# Patient Record
Sex: Female | Born: 1975
Health system: Southern US, Community
[De-identification: ages and names within clinical notes are randomized; demographics above are authoritative.]

## PROBLEM LIST (undated history)

## (undated) DIAGNOSIS — J45909 Unspecified asthma, uncomplicated: Secondary | ICD-10-CM

## (undated) DIAGNOSIS — C4491 Basal cell carcinoma of skin, unspecified: Secondary | ICD-10-CM

## (undated) DIAGNOSIS — D239 Other benign neoplasm of skin, unspecified: Secondary | ICD-10-CM

## (undated) DIAGNOSIS — K219 Gastro-esophageal reflux disease without esophagitis: Secondary | ICD-10-CM

## (undated) HISTORY — DX: Unspecified asthma, uncomplicated: J45.909

## (undated) HISTORY — DX: Gastro-esophageal reflux disease without esophagitis: K21.9

## (undated) HISTORY — DX: Other benign neoplasm of skin, unspecified: D23.9

## (undated) HISTORY — DX: Basal cell carcinoma of skin, unspecified: C44.91

---

## 2013-12-01 ENCOUNTER — Encounter: Payer: Self-pay | Admitting: Internal Medicine

## 2014-01-21 ENCOUNTER — Ambulatory Visit: Payer: Self-pay | Admitting: Family Medicine

## 2014-02-03 ENCOUNTER — Ambulatory Visit (INDEPENDENT_AMBULATORY_CARE_PROVIDER_SITE_OTHER): Payer: 59 | Admitting: Family Medicine

## 2014-02-03 ENCOUNTER — Encounter: Payer: Self-pay | Admitting: Family Medicine

## 2014-02-03 VITALS — BP 108/68 | HR 54 | Temp 97.8°F | Ht 63.25 in | Wt 119.0 lb

## 2014-02-03 DIAGNOSIS — K219 Gastro-esophageal reflux disease without esophagitis: Secondary | ICD-10-CM

## 2014-02-03 DIAGNOSIS — Z7689 Persons encountering health services in other specified circumstances: Secondary | ICD-10-CM

## 2014-02-03 DIAGNOSIS — J4520 Mild intermittent asthma, uncomplicated: Secondary | ICD-10-CM

## 2014-02-03 DIAGNOSIS — J45909 Unspecified asthma, uncomplicated: Secondary | ICD-10-CM

## 2014-02-03 MED ORDER — ALBUTEROL SULFATE HFA 108 (90 BASE) MCG/ACT IN AERS: 2.0000 | INHALATION_SPRAY | RESPIRATORY_TRACT | Status: DC | PRN

## 2014-02-03 MED ORDER — ESOMEPRAZOLE MAGNESIUM 20 MG PO CPDR
20.0000 mg | DELAYED_RELEASE_CAPSULE | Freq: Every day | ORAL | Status: DC
Start: 2014-02-03 — End: 2017-10-28

## 2014-02-03 NOTE — Patient Instructions (Addendum)
-  wear good sunscreen  -We have ordered labs or studies at this visit. It can take up to 1-2 weeks for results and processing. We will contact you with instructions IF your results are abnormal. Normal results will be released to your [place]. If you have not heard from [place] or can not find your results in [facility] in 2 weeks please contact our office.  -PLEASE SIGN UP FOR MYCHART TODAY   We recommend the following healthy lifestyle measures: - eat a healthy diet consisting of lots of vegetables, fruits, beans, nuts, seeds, healthy meats such as white chicken and fish and whole grains.  - avoid fried foods, fast food, processed foods, sodas, red meet and other fattening foods.  - get a least 150 minutes of aerobic exercise per week.   Follow up in: 1 year or as needed

## 2014-02-03 NOTE — Progress Notes (Signed)
Pre visit review using our clinic review tool, if applicable. No additional management support is needed unless otherwise documented below in the visit note. 

## 2014-02-03 NOTE — Progress Notes (Signed)
No chief complaint on file.   HPI:  Adrena Nakamura is here to establish care. Recently moved here from [place]. Last PCP and physical: Marylynn Pearson - gyn, has IUD, gets pap yearly  Has the following chronic problems and concerns today:  There are no active problems to display for this patient.  GERD: -for about 5 years since pregnancy -on nexium 22.3 mg -heartburn dailly more at night when she lies down if does not take this -no symptoms if takes, asthma symptoms, wheezing, dysphagia, nausea, weight loss, chronic sinus issues  Asthma, Mild Intermittent: -never had PFTs -gets cough with URIs and responds well to  qvar and albuterol  -denies: wheezing, SOB, DOE  ROS negative for unless reported above: fevers, unintentional weight loss, hearing or vision loss, chest pain, palpitations, struggling to breath, hemoptysis, melena, hematochezia, hematuria, falls, loc, si, thoughts of self harm  Past Medical History  Diagnosis Date  . Asthma   . GERD (gastroesophageal reflux disease)   . Dysplastic nevi   . BCC (basal cell carcinoma of skin)     Family History  Problem Relation Age of Onset  . Cancer Mother     lymphoma  . Cancer Maternal Grandmother     ? leukemia, uterine cancer  . COPD Paternal Grandmother     History   Social History  . Marital Status: Married    Spouse Name: N/A    Number of Children: N/A  . Years of Education: N/A   Social History Main Topics  . Smoking status: Never Smoker   . Smokeless tobacco: None  . Alcohol Use: No     Comment: very rare  . Drug Use: Yes  . Sexual Activity: None   Other Topics Concern  . 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 [employer] Situation: lives with husband Denyse Amass - children 5 and 28 yo      Spiritual Beliefs: Christian      Lifestyle:  Regular CV exercise; diet is healthy             Current outpatient prescriptions:esomeprazole  (NEXIUM) 20 MG capsule, Take 1 capsule (20 mg total) by mouth daily at 12 noon., Disp: 90 capsule, Rfl: 3;  albuterol (PROVENTIL HFA;VENTOLIN HFA) 108 (90 BASE) MCG/ACT inhaler, Inhale 2 puffs into the lungs every 4 (four) hours as needed for wheezing or shortness of breath., Disp: 1 Inhaler, Rfl: 2;  beclomethasone (QVAR) 40 MCG/ACT inhaler, Inhale into the lungs 2 (two) times daily., Disp: , Rfl:   EXAM:  Filed Vitals:   02/03/14 1435  BP: 108/68  Pulse: 54  Temp: 97.8 F (36.6 C)    Body mass index is 20.9 kg/(m^2).  GENERAL: vitals reviewed and listed above, alert, oriented, appears well hydrated and in no acute distress  HEENT: atraumatic, conjunttiva clear, no obvious abnormalities on inspection of external nose and ears  NECK: no obvious masses on inspection  LUNGS: clear to auscultation bilaterally, no wheezes, rales or rhonchi, good air movement  CV: HRRR, no peripheral edema  MS: moves all extremities without noticeable abnormality  PSYCH: pleasant and cooperative, no obvious depression or anxiety  ASSESSMENT AND PLAN:  Discussed the following assessment and plan:  Encounter to establish care  Gastroesophageal reflux disease without esophagitis - Plan: esomeprazole (NEXIUM) 20 MG capsule  Mild intermittent asthma, uncomplicated - Plan: albuterol (PROVENTIL HFA;VENTOLIN HFA) 108 (90 BASE) MCG/ACT inhaler  -We reviewed  the PMH, PSH, FH, SH, Meds and Allergies. -We provided refills for any medications we will prescribe as needed. -We addressed current concerns per orders and patient instructions. -We have asked for records for pertinent exams, studies, vaccines and notes from previous providers. -We have advised patient to follow up per instructions below   -Patient advised to return or notify a doctor immediately if symptoms worsen or persist or new concerns arise.  Patient Instructions  -wear good sunscreen  -We have ordered labs or studies at this visit. It  can take up to 1-2 weeks for results and processing. We will contact you with instructions IF your results are abnormal. Normal results will be released to your [facility]. If you have not heard from [place] or can not find your results in [facility] in 2 weeks please contact our office.  -PLEASE SIGN UP FOR MYCHART TODAY   We recommend the following healthy lifestyle measures: - eat a healthy diet consisting of lots of vegetables, fruits, beans, nuts, seeds, healthy meats such as white chicken and fish and whole grains.  - avoid fried foods, fast food, processed foods, sodas, red meet and other fattening foods.  - get a least 150 minutes of aerobic exercise per week.   Follow up in: 1 year or as needed      Cashay Manganelli, Jarrett Soho R.

## 2014-02-08 ENCOUNTER — Ambulatory Visit: Payer: Self-pay | Admitting: Internal Medicine

## 2015-03-31 ENCOUNTER — Encounter: Payer: Self-pay | Admitting: Family Medicine

## 2015-03-31 ENCOUNTER — Ambulatory Visit (INDEPENDENT_AMBULATORY_CARE_PROVIDER_SITE_OTHER)
Admission: RE | Admit: 2015-03-31 | Discharge: 2015-03-31 | Disposition: A | Discharge status: Home / Self Care | Payer: 59 | Source: Ambulatory Visit | Attending: Family Medicine | Admitting: Family Medicine

## 2015-03-31 ENCOUNTER — Ambulatory Visit (INDEPENDENT_AMBULATORY_CARE_PROVIDER_SITE_OTHER): Payer: 59 | Admitting: Family Medicine

## 2015-03-31 VITALS — BP 108/70 | HR 84 | Temp 98.3°F | Wt 117.0 lb

## 2015-03-31 DIAGNOSIS — J4520 Mild intermittent asthma, uncomplicated: Secondary | ICD-10-CM

## 2015-03-31 DIAGNOSIS — J069 Acute upper respiratory infection, unspecified: Secondary | ICD-10-CM | POA: Diagnosis not present

## 2015-03-31 DIAGNOSIS — R05 Cough: Secondary | ICD-10-CM

## 2015-03-31 DIAGNOSIS — R059 Cough, unspecified: Secondary | ICD-10-CM

## 2015-03-31 MED ORDER — FLUTICASONE PROPIONATE HFA 44 MCG/ACT IN AERO: 2.0000 | INHALATION_SPRAY | Freq: Two times a day (BID) | RESPIRATORY_TRACT | Status: DC

## 2015-03-31 MED ORDER — ALBUTEROL SULFATE HFA 108 (90 BASE) MCG/ACT IN AERS: 2.0000 | INHALATION_SPRAY | RESPIRATORY_TRACT | Status: AC | PRN

## 2015-03-31 MED ORDER — PREDNISONE 20 MG PO TABS: 40.0000 mg | ORAL_TABLET | Freq: Every day | ORAL | Status: DC

## 2015-03-31 NOTE — Patient Instructions (Signed)
Please go get the chest xray.  Continue flonase and asthma medications  Afrin nasal spray for 3 days - then STOP  Use the prednisone if cough persists  Follow up if worsening or not improving as expected or other concerns

## 2015-03-31 NOTE — Progress Notes (Signed)
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  Past Medical History  Diagnosis Date  . Asthma   . GERD (gastroesophageal reflux disease)   . Dysplastic nevi   . BCC (basal cell carcinoma of skin)     No past surgical history on file.  Family History  Problem Relation Age of Onset  . Cancer Mother     lymphoma  . Cancer Maternal Grandmother     ? leukemia, uterine cancer  . COPD Paternal Grandmother     Social History   Social History  . Marital Status: Married    Spouse Name: N/A  . Number of Children: N/A  . Years of Education: N/A   Social History Main Topics  . Smoking status: Never Smoker   . Smokeless tobacco: None  . Alcohol Use: No     Comment: very rare  . Drug Use: Yes  . Sexual Activity: Not Asked   Other Topics Concern  . 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  albuterol (PROVENTIL HFA;VENTOLIN HFA) 108 (90 BASE) MCG/ACT inhaler, Inhale 2 puffs into the lungs every 4 (four) hours as needed for wheezing or shortness of breath., Disp: 1 Inhaler, Rfl: 2 .  esomeprazole (NEXIUM) 20 MG capsule, Take 1 capsule (20 mg total) by mouth daily at 12 noon., Disp: 90 capsule, Rfl: 3 .   fluticasone (FLONASE) 50 MCG/ACT nasal spray, Place into both nostrils daily., Disp: , Rfl:  .  fluticasone (FLOVENT HFA) 44 MCG/ACT inhaler, Inhale 2 puffs into the lungs 2 (two) times daily., Disp: 1 Inhaler, Rfl: 3 .  predniSONE (DELTASONE) 20 MG tablet, Take 2 tablets (40 mg total) by mouth daily with breakfast., Disp: 8 tablet, Rfl: 0  EXAM:  Filed Vitals:   03/31/15 1129  BP: 108/70  Pulse: 84  Temp: 98.3 F (36.8 C)    Body mass index is 20.55 kg/(m^2).  GENERAL: vitals reviewed and listed above, alert, oriented, appears well hydrated and in no acute distress  HEENT: atraumatic, conjunttiva clear, no obvious abnormalities on inspection of external nose and ears, normal appearance of ear canals and TMs, clear nasal congestion, mild post oropharyngeal erythema with PND, no tonsillar edema or exudate, no sinus TTP  NECK: no obvious masses on inspection  LUNGS: clear to auscultation bilaterally, no wheezes, rales or rhonchi, good air movement  CV: HRRR, no peripheral edema  MS: moves all extremities without noticeable abnormality  PSYCH: pleasant and cooperative, no obvious depression or anxiety  ASSESSMENT AND PLAN:  Discussed the following assessment and plan:  Cough - Plan: DG  Chest 2 View  Acute upper respiratory infection  Mild intermittent asthma, uncomplicated - Plan: albuterol (PROVENTIL HFA;VENTOLIN HFA) 108 (90 BASE) MCG/ACT inhaler  -query viral upper resp infection with mild bronchitis, possible asthma exacerbation, possible CAP -opted for CXR, prednisone, abx if PNA and tx of nasal congestion with short course nasal decongestant and flonase -refilled inhalers -advised to consider PFT and she will call if wishes to schedule this -Patient advised to return or notify a doctor immediately if symptoms worsen or persist or new concerns arise.  Patient Instructions  Please go get the chest xray.  Continue flonase and asthma medications  Afrin nasal spray  for 3 days - then STOP  Use the prednisone if cough persists  Follow up if worsening or not improving as expected or other concerns     Jovane Foutz R.

## 2015-12-26 ENCOUNTER — Encounter: Payer: Self-pay | Admitting: Gastroenterology

## 2015-12-27 ENCOUNTER — Encounter: Payer: Self-pay | Admitting: Physician Assistant

## 2016-01-04 DIAGNOSIS — Z6820 Body mass index (BMI) 20.0-20.9, adult: Secondary | ICD-10-CM | POA: Diagnosis not present

## 2016-01-04 DIAGNOSIS — Z01419 Encounter for gynecological examination (general) (routine) without abnormal findings: Secondary | ICD-10-CM | POA: Diagnosis not present

## 2016-01-18 ENCOUNTER — Encounter: Payer: Self-pay | Admitting: Physician Assistant

## 2016-01-18 ENCOUNTER — Ambulatory Visit (INDEPENDENT_AMBULATORY_CARE_PROVIDER_SITE_OTHER): Payer: 59 | Admitting: Physician Assistant

## 2016-01-18 VITALS — BP 100/70 | HR 72 | Ht 63.25 in | Wt 118.8 lb

## 2016-01-18 DIAGNOSIS — K219 Gastro-esophageal reflux disease without esophagitis: Secondary | ICD-10-CM

## 2016-01-18 NOTE — Progress Notes (Signed)
Subjective:    Patient ID: Krystal Butler, female    DOB: [birth year], 40 y.o.   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 Patient also has concerns about her long-term use of PPI therapy and potential long-term side effects. Patient says over the past 7 years she has had ongoing problems with heartburn and indigestion which at times is "miserable". She says this started after she had her second child. She initially tried over-the-counter Zantac and Pepcid and then moved to OTC Nexium 20 mg by mouth daily. She says the Nexium controls her symptoms very well most of the time and less she drinks red wine or eats a heavy meal etc. he may have some breakthrough symptoms and need to take Tums. She says if she doesn't take the Nexium she very quickly develops epigastric discomfort ,reflux and heartburn. Has no complaints of dysphagia or odynophagia. She gave herself a trial earlier this summer off Nexium and was off for about a week but was miserable.  Review of Systems Pertinent positive and negative review of systems were noted in the above HPI section.  All other review of systems was otherwise negative.  Outpatient Encounter Prescriptions as of 01/18/2016  Medication Sig  . albuterol (PROVENTIL HFA;VENTOLIN HFA) 108 (90 BASE) MCG/ACT inhaler Inhale 2 puffs into the lungs every 4 (four) hours as needed for wheezing or shortness of breath.  . esomeprazole (NEXIUM) 20 MG capsule Take 1 capsule (20 mg total) by mouth daily at 12 noon.  . [DISCONTINUED] fluticasone (FLONASE) 50 MCG/ACT nasal spray Place into both nostrils daily.  . [DISCONTINUED] fluticasone (FLOVENT HFA) 44 MCG/ACT inhaler Inhale 2 puffs into the lungs 2 (two) times daily.  . [DISCONTINUED] predniSONE (DELTASONE) 20 MG tablet Take 2 tablets (40 mg total) by mouth daily with breakfast.   No facility-administered  encounter medications on file as of 01/18/2016.    No Known Allergies There are no active problems to display for this patient.  Social History   Social History  . Marital status: Married    Spouse name: N/A  . Number of children: 2  . Years of education: N/A   Occupational History  . Pediatrician    Social History Main Topics  . Smoking status: Never Smoker  . Smokeless tobacco: Never Used  . Alcohol use No     Comment: very rare  . Drug use:   . Sexual activity: Not on file   Other Topics Concern  . 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 Ojo family history includes COPD in her paternal grandmother; Lymphoma in her mother; Ovarian cancer in her maternal grandmother.      Objective:    Vitals:   01/18/16 0924  BP: 100/70  Pulse: 72    Physical Exam   well-developed young white female in no acute distress, pleasant blood pressure 100/70 pulse 72, height 5 foot 3 weight 118 BMI of 20.8. 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 Generally well controlled on 20 mg of Nexium by mouth daily with occasional breakthrough.  Plan; Will schedule for EGD with Dr. Silverio Decamp to rule out Barrett's, large hiatal hernia and incompetent LES etc. Procedure  discussed in detail with the patient including risks and benefits and she is agreeable to proceed. We discussed, weaning off of PPI with every other day dosing and/or substitution of prescription strength Zantac or Pepcid twice a day which are often very effective. She would like to defer any changes in medication until post EGD. Antireflux diet and literature were provided.   Leylany Nored S Walida Cajas PA-C 01/18/2016   Cc: Lucretia Kern, DO

## 2016-01-18 NOTE — Patient Instructions (Signed)
Continue the Nexium 20 mg over the counter , take 1 every morning for now.  We have given you anti-reflux  Information.  You have been scheduled for an endoscopy. Please follow written instructions given to you at your visit today. If you use inhalers (even only as needed), please bring them with you on the day of your procedure. Your physician has requested that you go to www.startemmi.com and enter the access code given to you at your visit today. This web site gives a general overview about your procedure. However, you should still follow specific instructions given to you by our office regarding your preparation for the procedure.

## 2016-01-22 NOTE — Progress Notes (Signed)
Reviewed and agree with documentation and assessment and plan. K. Veena Neshawn Aird , MD   

## 2016-01-25 ENCOUNTER — Encounter: Payer: Self-pay | Admitting: Gastroenterology

## 2016-01-25 ENCOUNTER — Ambulatory Visit (AMBULATORY_SURGERY_CENTER): Payer: 59 | Admitting: Gastroenterology

## 2016-01-25 VITALS — BP 93/57 | HR 49 | Temp 98.6°F | Resp 16 | Ht 63.25 in | Wt 118.0 lb

## 2016-01-25 DIAGNOSIS — K209 Esophagitis, unspecified without bleeding: Secondary | ICD-10-CM

## 2016-01-25 DIAGNOSIS — K219 Gastro-esophageal reflux disease without esophagitis: Secondary | ICD-10-CM

## 2016-01-25 DIAGNOSIS — B3781 Candidal esophagitis: Secondary | ICD-10-CM | POA: Diagnosis not present

## 2016-01-25 MED ORDER — SODIUM CHLORIDE 0.9 % IV SOLN: 500.0000 mL | INTRAVENOUS | Status: AC

## 2016-01-25 NOTE — Progress Notes (Signed)
Teeth unchanged after procedure.A and O x3. Report to RN. Tolerated MAC anesthesia well. 

## 2016-01-25 NOTE — Patient Instructions (Signed)
YOU HAD AN ENDOSCOPIC PROCEDURE TODAY AT [facility]:   Refer to the procedure report that was given to you for any specific questions about what was found during the examination.  If the procedure report does not answer your questions, please call your gastroenterologist to clarify.  If you requested that your care partner not be given the details of your procedure findings, then the procedure report has been included in a sealed envelope for you to review at your convenience later.  YOU SHOULD EXPECT: Some feelings of bloating in the abdomen. Passage of more gas than usual.  Walking can help get rid of the air that was put into your GI tract during the procedure and reduce the bloating. If you had a lower endoscopy (such as a colonoscopy or flexible sigmoidoscopy) you may notice spotting of blood in your stool or on the toilet paper. If you underwent a bowel prep for your procedure, you may not have a normal bowel movement for a few days.  Please Note:  You might notice some irritation and congestion in your nose or some drainage.  This is from the oxygen used during your procedure.  There is no need for concern and it should clear up in a day or so.  SYMPTOMS TO REPORT IMMEDIATELY:    Following upper endoscopy (EGD)  Vomiting of blood or coffee ground material  New chest pain or pain under the shoulder blades  Painful or persistently difficult swallowing  New shortness of breath  Fever of 100F or higher  Black, tarry-looking stools  For urgent or emergent issues, a gastroenterologist can be reached at any hour by calling [phone].   DIET:  We do recommend a small meal at first, but then you may proceed to your regular diet.  Drink plenty of fluids but you should avoid alcoholic beverages for 24 hours.  ACTIVITY:  You should plan to take it easy for the rest of today and you should NOT DRIVE or use heavy machinery until tomorrow (because of the sedation medicines used  during the test).    FOLLOW UP: Our staff will call the number listed on your records the next business day following your procedure to check on you and address any questions or concerns that you may have regarding the information given to you following your procedure. If we do not reach you, we will leave a message.  However, if you are feeling well and you are not experiencing any problems, there is no need to return our call.  We will assume that you have returned to your regular daily activities without incident.  If any biopsies were taken you will be contacted by phone or by letter within the next 1-3 weeks.  Please call us at [phone] if you have not heard about the biopsies in 3 weeks.    SIGNATURES/CONFIDENTIALITY: You and/or your care partner have signed paperwork which will be entered into your electronic medical record.  These signatures attest to the fact that that the information above on your After Visit Summary has been reviewed and is understood.  Full responsibility of the confidentiality of this discharge information lies with you and/or your care-partner.   No NSAIDS. Continue present medications.

## 2016-01-25 NOTE — Op Note (Signed)
[facility] Patient Name: Krystal Butler Procedure Date: 01/25/2016 3:24 PM MRN: [id number] Endoscopist: Mauri Pole , MD Age: 40 Referring MD:  Date of Birth: July 24, 1975 Gender: Female Account #: [id number] Procedure:                Upper GI endoscopy Indications:              Esophageal reflux symptoms that recur despite                            appropriate therapy, Heartburn, Gastro-esophageal                            reflux disease Medicines:                Monitored Anesthesia Care Procedure:                Pre-Anesthesia Assessment:                           - Prior to the procedure, a History and Physical                            was performed, and patient medications and                            allergies were reviewed. The patient's tolerance of                            previous anesthesia was also reviewed. The risks                            and benefits of the procedure and the sedation                            options and risks were discussed with the patient.                            All questions were answered, and informed consent                            was obtained. Prior Anticoagulants: The patient has                            taken no previous anticoagulant or antiplatelet                            agents. ASA Grade Assessment: II - A patient with                            mild systemic disease. After reviewing the risks                            and benefits, the patient was deemed in  satisfactory condition to undergo the procedure.                           After obtaining informed consent, the endoscope was                            passed under direct vision. Throughout the                            procedure, the patient's blood pressure, pulse, and                            oxygen saturations were monitored continuously. The                            Model GIF-[id number]) scope  was introduced                            through the mouth, and advanced to the second part                            of duodenum. The upper GI endoscopy was                            accomplished without difficulty. The patient                            tolerated the procedure well. Scope In: Scope Out: Findings:                 White nummular lesions were noted in the middle                            third of the esophagus. Biopsies were obtained from                            the mid and distal esophagus with cold forceps for                            histology of suspected eosinophilic esophagitis.                            Regular Z-line at 36cm.                           The stomach was normal.                           The examined duodenum was normal. Complications:            No immediate complications. Estimated Blood Loss:     Estimated blood loss was minimal. Impression:               - White nummular lesions in esophageal mucosa.  Biopsied.                           - Normal stomach.                           - Normal examined duodenum. Recommendation:           - Patient has a contact number available for                            emergencies. The signs and symptoms of potential                            delayed complications were discussed with the                            patient. Return to normal activities tomorrow.                            Written discharge instructions were provided to the                            patient.                           - Resume previous diet.                           - Continue present medications.                           - Await pathology results.                           - No ibuprofen, naproxen, or other non-steroidal                            anti-inflammatory drugs.                           - Repeat upper endoscopy after studies are complete                            for  surveillance based on pathology results. Mauri Pole, MD 01/25/2016 3:52:40 PM This report has been signed electronically.

## 2016-01-26 ENCOUNTER — Telehealth: Payer: Self-pay

## 2016-01-26 NOTE — Telephone Encounter (Signed)
  Follow up Call-  Call back number 01/25/2016  Post procedure Call Back phone  # [phone]  Permission to leave phone message Yes     Patient questions:  Do you have a fever, pain , or abdominal swelling? No. Pain Score  0 *  Have you tolerated food without any problems? Yes.    Have you been able to return to your normal activities? Yes.    Do you have any questions about your discharge instructions: Diet   No. Medications  No. Follow up visit  No.  Do you have questions or concerns about your Care? No.  Actions: * If pain score is 4 or above: No action needed, pain <4.

## 2016-01-31 ENCOUNTER — Other Ambulatory Visit: Payer: Self-pay

## 2016-01-31 MED ORDER — FLUCONAZOLE 100 MG PO TABS: 100.0000 mg | ORAL_TABLET | Freq: Every day | ORAL | 0 refills | Status: AC

## 2016-02-29 ENCOUNTER — Ambulatory Visit: Payer: 59 | Admitting: Gastroenterology

## 2016-03-29 DIAGNOSIS — D2272 Melanocytic nevi of left lower limb, including hip: Secondary | ICD-10-CM | POA: Diagnosis not present

## 2016-03-29 DIAGNOSIS — Z85828 Personal history of other malignant neoplasm of skin: Secondary | ICD-10-CM | POA: Diagnosis not present

## 2016-03-29 DIAGNOSIS — L814 Other melanin hyperpigmentation: Secondary | ICD-10-CM | POA: Diagnosis not present

## 2016-03-29 DIAGNOSIS — D2262 Melanocytic nevi of left upper limb, including shoulder: Secondary | ICD-10-CM | POA: Diagnosis not present

## 2016-03-29 DIAGNOSIS — D224 Melanocytic nevi of scalp and neck: Secondary | ICD-10-CM | POA: Diagnosis not present

## 2016-03-29 DIAGNOSIS — D485 Neoplasm of uncertain behavior of skin: Secondary | ICD-10-CM | POA: Diagnosis not present

## 2016-03-29 DIAGNOSIS — D225 Melanocytic nevi of trunk: Secondary | ICD-10-CM | POA: Diagnosis not present

## 2016-03-30 IMAGING — DX DG CHEST 2V
2 series · 2 of 2 positions shown · non-contrast
Comparison: None.

CLINICAL DATA: Productive cough 3 weeks

EXAM:
CHEST  2 VIEW

[chest pa]
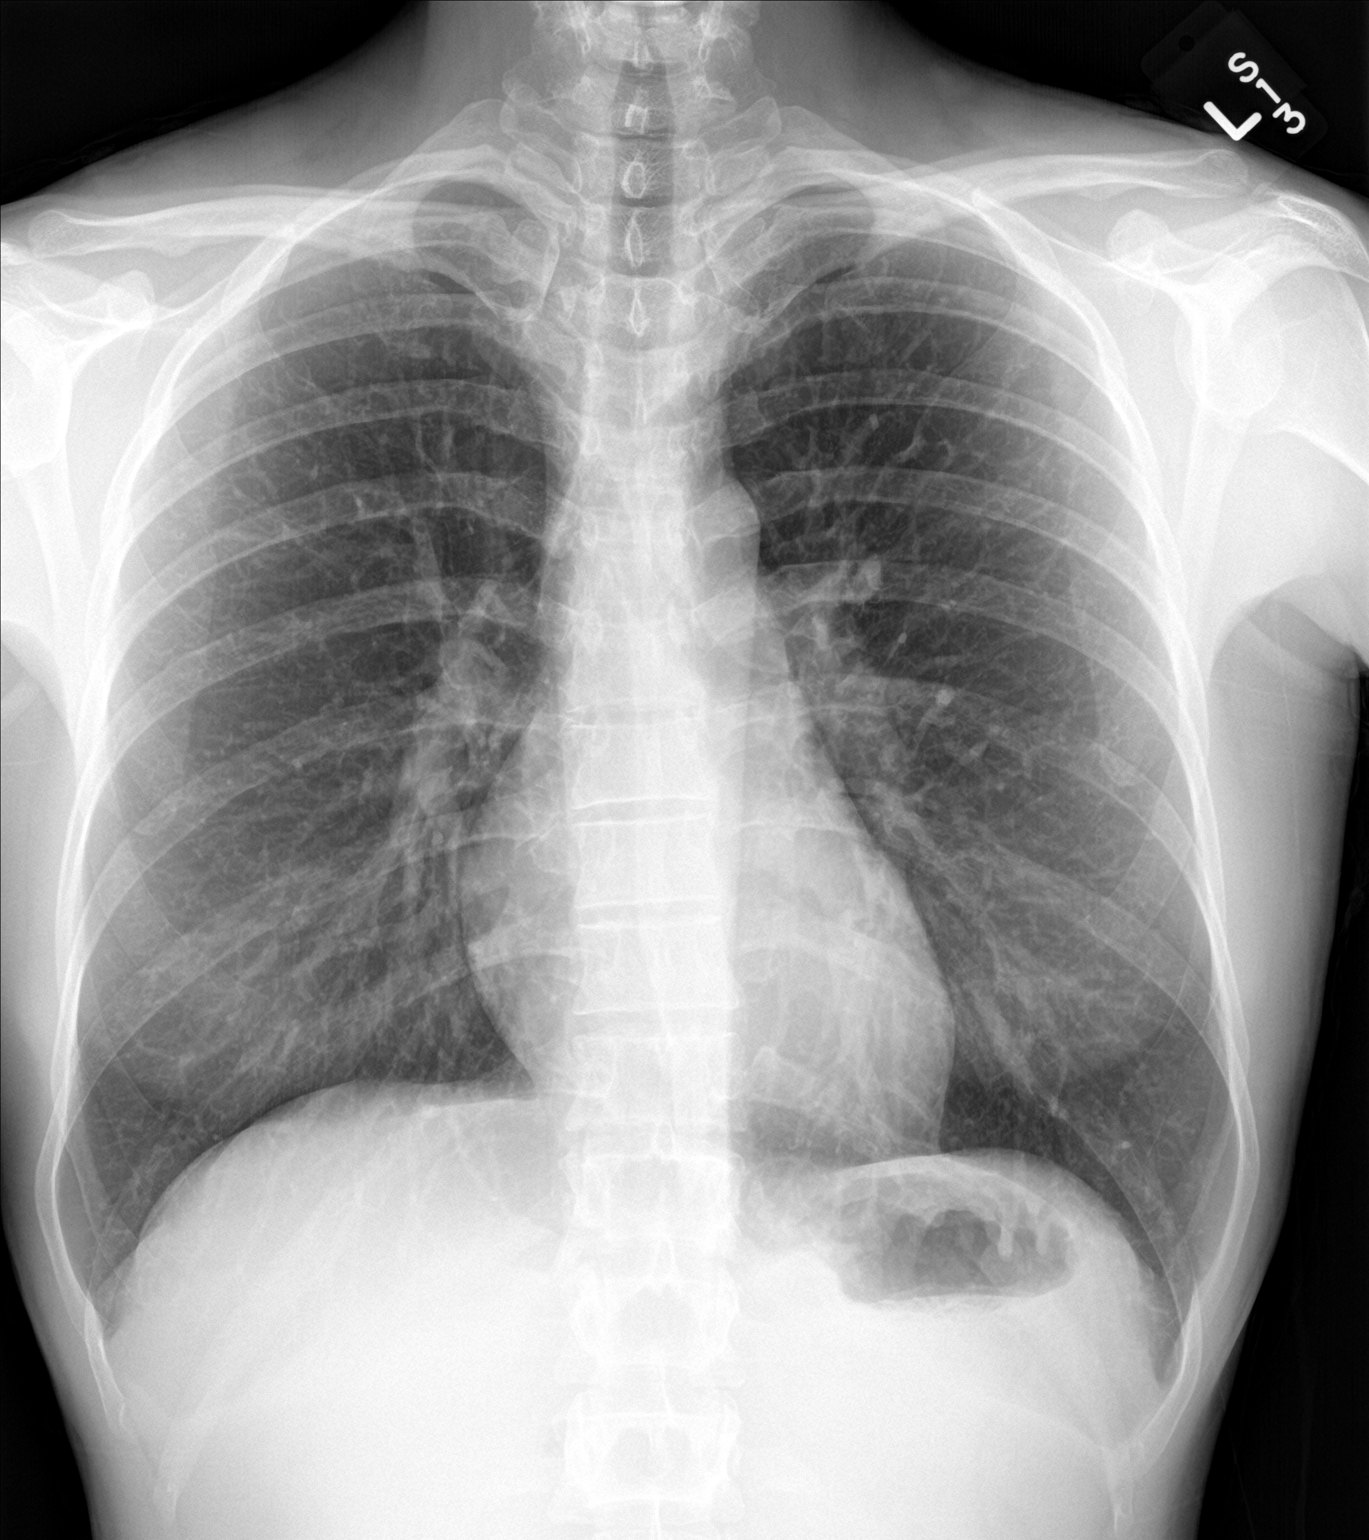

[chest lat]
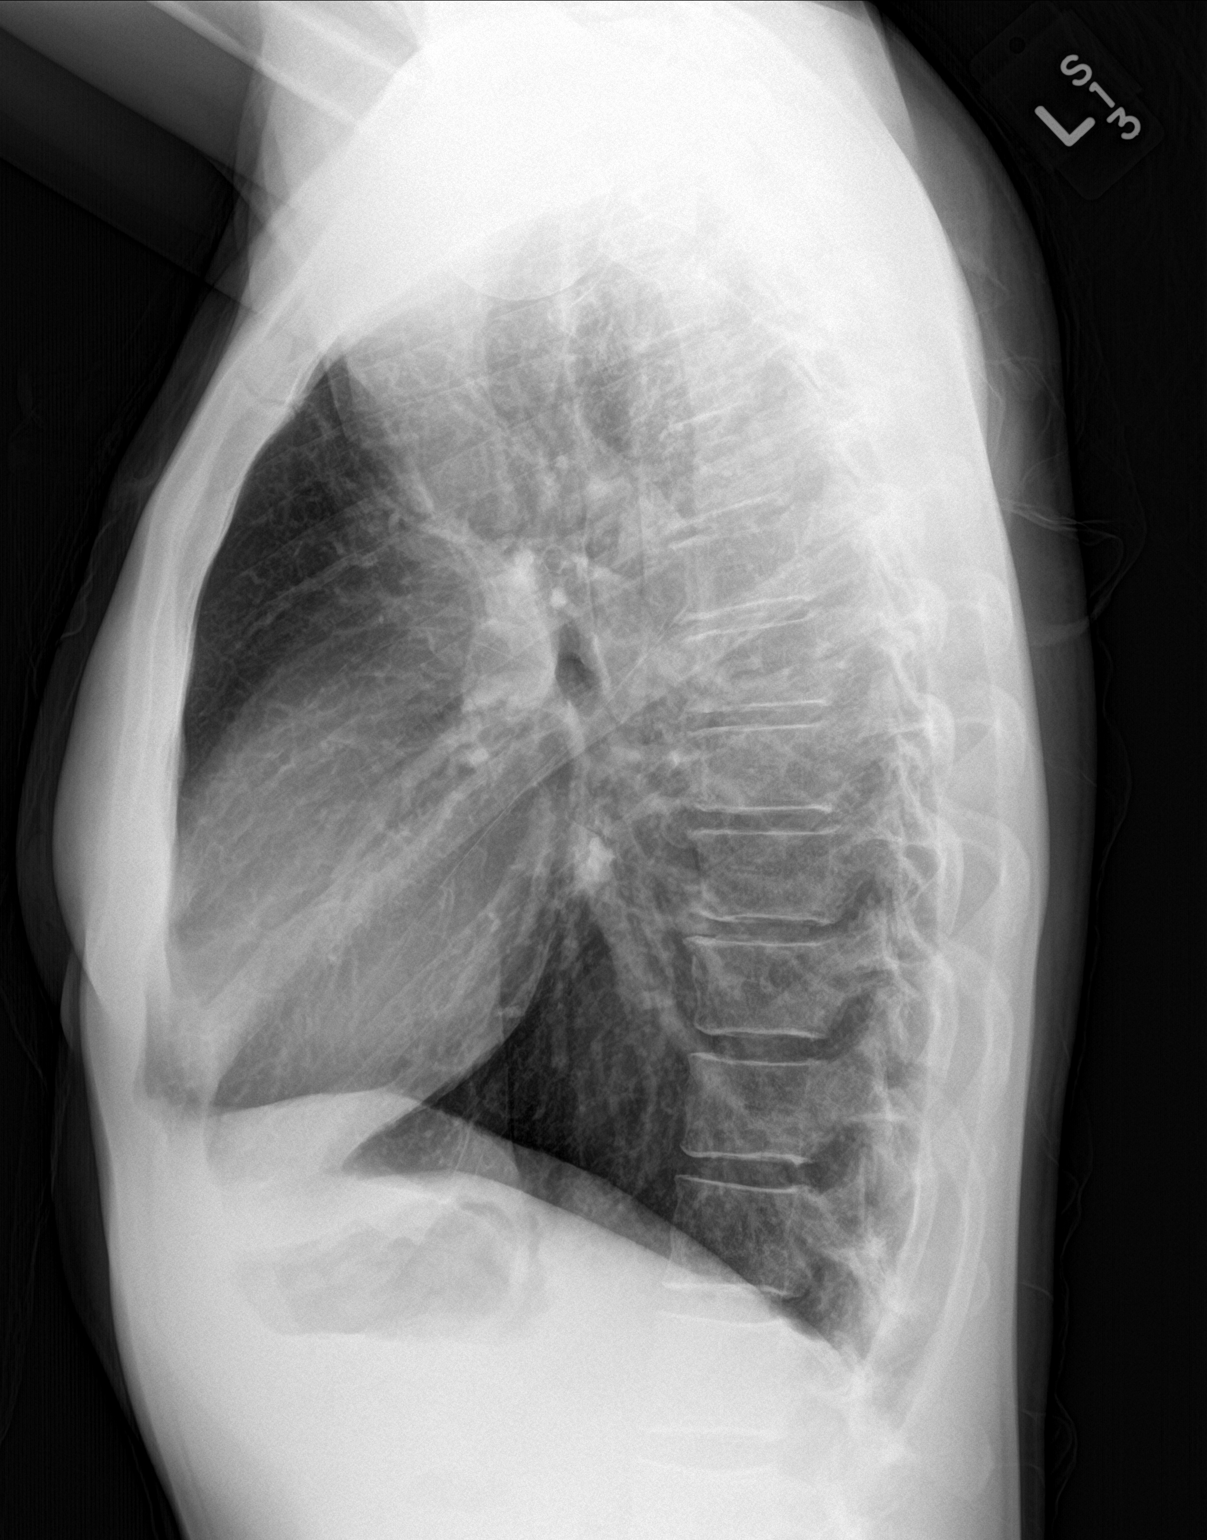

[2 of 2 positions shown; findings below may reference images not displayed]

FINDINGS: The heart size and mediastinal contours are within normal limits.
Both lungs are clear. The visualized skeletal structures are
unremarkable.
IMPRESSION: No active cardiopulmonary disease.

## 2016-07-30 MED FILL — AZITHROMYCIN 500 MG TABLET: 500 | 3 days supply | Qty: 3 | Fill #0

## 2017-02-27 ENCOUNTER — Encounter: Payer: Self-pay | Admitting: Family Medicine

## 2017-06-19 ENCOUNTER — Encounter: Payer: Self-pay | Admitting: Family Medicine

## 2017-10-27 DIAGNOSIS — L814 Other melanin hyperpigmentation: Secondary | ICD-10-CM | POA: Diagnosis not present

## 2017-10-27 DIAGNOSIS — Z85828 Personal history of other malignant neoplasm of skin: Secondary | ICD-10-CM | POA: Diagnosis not present

## 2017-10-27 DIAGNOSIS — D225 Melanocytic nevi of trunk: Secondary | ICD-10-CM | POA: Diagnosis not present

## 2017-10-27 DIAGNOSIS — Z872 Personal history of diseases of the skin and subcutaneous tissue: Secondary | ICD-10-CM | POA: Diagnosis not present

## 2017-10-27 NOTE — Progress Notes (Signed)
HPI:  Using dictation device. Unfortunately this device frequently misinterprets words/phrases.  Here for CPE: Due for tetanus booster, pap, labs, hiv testing, ? mammo Thinks tetanus done. Sees gyn for pelvic, paps, mammo and utd and scheduled for yearly exam. Sees dermatologist for skin exams. Declined hiv testing. Fasting for labs  Going to part time outpatient pediatrics and is excited about this.  -Concerns and/or follow up today:   Chronic medical problems summarized below were reviewed for changes.  GERD: -started after pregnancy, now off of nexium and much better -hx yeast esophagitis in 2017, had EGD  Asthma, Mild Intermittent: -never had PFTs -uses flovent in the spring -gets cough with URIs and responds well to  qvar and albuterol    -Diet: variety of foods, balance and well rounded -Exercise: regular exercise -Taking folic acid, vitamin D or calcium: no but takes children's vitamins somtimes -Diabetes and Dyslipidemia Screening: fasting for labs -Vaccines: see vaccine section EPIC -pap history: saw Marylynn Pearson in the past - reports scheduled -FDLMP: see nursing notes -sexual activity: yes, female partner, no new partners -wants STI testing (Hep C if born [birth year]): no -FH breast, colon or ovarian ca: see FH Last mammogram: done in the past for fh ovarian ca, sees gyn Last colon cancer screening: n/a Breast Ca Risk Assessment: see family history and pt history DEXA (>/= 65): n/a  -Alcohol, Tobacco, drug use: see social history  Review of Systems - no reported fevers, unintentional weight loss, vision loss, hearing loss, chest pain, sob, hemoptysis, melena, hematochezia, hematuria, genital discharge, changing or concerning skin lesions, bleeding, bruising, loc, thoughts of self harm or SI  Past Medical History:  Diagnosis Date  . Asthma   . BCC (basal cell carcinoma of skin)   . Dysplastic nevi   . GERD (gastroesophageal reflux disease)     History  reviewed. No pertinent surgical history.  Family History  Problem Relation Age of Onset  . Lymphoma Mother   . Ovarian cancer Maternal Grandmother   . COPD Paternal Grandmother     Social History   Socioeconomic History  . Marital status: Married    Spouse name: Not on file  . Number of children: 2  . Years of education: Not on file  . Highest education level: Not on file  Occupational History  . Occupation: Pediatrician  Social Needs  . Financial resource strain: Not on file  . Food insecurity:    Worry: Not on file    Inability: Not on file  . Transportation needs:    Medical: Not on file    Non-medical: Not on file  Tobacco Use  . Smoking status: Never Smoker  . Smokeless tobacco: Never Used  Substance and Sexual Activity  . Alcohol use: No    Comment: very rare  . Drug use: Yes  . Sexual activity: Yes  Lifestyle  . Physical activity:    Days per week: Not on file    Minutes per session: Not on file  . Stress: Not on file  Relationships  . Social connections:    Talks on phone: Not on file    Gets together: Not on file    Attends religious service: Not on file    Active member of club or organization: Not on file    Attends meetings of clubs or organizations: Not on file    Relationship status: Not on file  Other Topics Concern  . Not on file  Social History Narrative   Work or School:  Full time pediatrician - instructor through [employer] Situation: lives with husband Denyse Amass - children 73 and 40 yo      Spiritual Beliefs: Christian      Lifestyle:  Regular CV exercise; diet is healthy              Current Outpatient Medications:  .  albuterol (PROVENTIL HFA;VENTOLIN HFA) 108 (90 BASE) MCG/ACT inhaler, Inhale 2 puffs into the lungs every 4 (four) hours as needed for wheezing or shortness of breath., Disp: 1 Inhaler, Rfl: 2 .  levonorgestrel (MIRENA) 20 MCG/24HR IUD, 1 each by Intrauterine route once., Disp: , Rfl:   Current Facility-Administered  Medications:  .  0.9 %  sodium chloride infusion, 500 mL, Intravenous, Continuous, Nandigam, Kavitha V, MD  EXAM:  Vitals:   10/28/17 0815  BP: (!) 80/60  Pulse: (!) 51  Temp: 98.1 F (36.7 C)    GENERAL: vitals reviewed and listed below, alert, oriented, appears well hydrated and in no acute distress  HEENT: head atraumatic, PERRLA, normal appearance of eyes, ears, nose and mouth. moist mucus membranes.  NECK: supple, no masses or lymphadenopathy  LUNGS: clear to auscultation bilaterally, no rales, rhonchi or wheeze  CV: HRRR, no peripheral edema or cyanosis, normal pedal pulses  ABDOMEN: bowel sounds normal, soft, non tender to palpation, no masses, no rebound or guarding  GU/BREAST: sees gyn for this exam  SKIN: sees dermatology for skin exams  MS: normal gait, moves all extremities normally  NEURO: normal gait, speech and thought processing grossly intact, muscle tone grossly intact throughout  PSYCH: normal affect, pleasant and cooperative  ASSESSMENT AND PLAN:  Discussed the following assessment and plan:  PREVENTIVE EXAM: -Discussed and advised all [place] preventive services health task force level A and B recommendations for age, sex and risks. -pt instructions below -labs, studies and vaccines per orders this encounter  Patient advised to return to clinic immediately if symptoms worsen or persist or new concerns.  Patient Instructions  BEFORE YOU LEAVE: -labs -follow up: yearly or as needed for CPE  We have ordered labs or studies at this visit. It can take up to 1-2 weeks for results and processing. IF results require follow up or explanation, we will call you with instructions. Clinically stable results will be released to your [facility]. If you have not heard from [place] or cannot find your results in [facility] in 2 weeks please contact our office at [phone].  If you are not yet signed up for [facility], please consider signing up.       Preventive Care  40-64 Years, Female Preventive care refers to lifestyle choices and visits with your health care provider that can promote health and wellness. What does preventive care include?  A yearly physical exam. This is also called an annual well check.  Dental exams once or twice a year.  Routine eye exams. Ask your health care provider how often you should have your eyes checked.  Personal lifestyle choices, including: ? Daily care of your teeth and gums. ? Regular physical activity. ? Eating a healthy diet. ? Avoiding tobacco and drug use. ? Limiting alcohol use. ? Practicing safe sex. ? Taking low-dose aspirin daily starting at age 3. ? Taking vitamin and mineral supplements as recommended by your health care provider. What happens during an annual well check? The services and screenings done by your health care provider during your annual well check will depend on your age, overall health, lifestyle  risk factors, and family history of disease. Counseling Your health care provider may ask you questions about your:  Alcohol use.  Tobacco use.  Drug use.  Emotional well-being.  Home and relationship well-being.  Sexual activity.  Eating habits.  Work and work Statistician.  Method of birth control.  Menstrual cycle.  Pregnancy history.  Screening You may have the following tests or measurements:  Height, weight, and BMI.  Blood pressure.  Lipid and cholesterol levels. These may be checked every 5 years, or more frequently if you are over 52 years old.  Skin check.  Lung cancer screening. You may have this screening every year starting at age 14 if you have a 30-pack-year history of smoking and currently smoke or have quit within the past 15 years.  Fecal occult blood test (FOBT) of the stool. You may have this test every year starting at age 67.  Flexible sigmoidoscopy or colonoscopy. You may have a sigmoidoscopy every 5 years or a colonoscopy every 10 years  starting at age 79.  Hepatitis C blood test.  Hepatitis B blood test.  Sexually transmitted disease (STD) testing.  Diabetes screening. This is done by checking your blood sugar (glucose) after you have not eaten for a while (fasting). You may have this done every 1-3 years.  Mammogram. This may be done every 1-2 years. Talk to your health care provider about when you should start having regular mammograms. This may depend on whether you have a family history of breast cancer.  BRCA-related cancer screening. This may be done if you have a family history of breast, ovarian, tubal, or peritoneal cancers.  Pelvic exam and Pap test. This may be done every 3 years starting at age 65. Starting at age 63, this may be done every 5 years if you have a Pap test in combination with an HPV test.  Bone density scan. This is done to screen for osteoporosis. You may have this scan if you are at high risk for osteoporosis.  Discuss your test results, treatment options, and if necessary, the need for more tests with your health care provider. Vaccines Your health care provider may recommend certain vaccines, such as:  Influenza vaccine. This is recommended every year.  Tetanus, diphtheria, and acellular pertussis (Tdap, Td) vaccine. You may need a Td booster every 10 years.  Varicella vaccine. You may need this if you have not been vaccinated.  Zoster vaccine. You may need this after age 32.  Measles, mumps, and rubella (MMR) vaccine. You may need at least one dose of MMR if you were born in 1957 or later. You may also need a second dose.  Pneumococcal 13-valent conjugate (PCV13) vaccine. You may need this if you have certain conditions and were not previously vaccinated.  Pneumococcal polysaccharide (PPSV23) vaccine. You may need one or two doses if you smoke cigarettes or if you have certain conditions.  Meningococcal vaccine. You may need this if you have certain conditions.  Hepatitis A  vaccine. You may need this if you have certain conditions or if you travel or work in places where you may be exposed to hepatitis A.  Hepatitis B vaccine. You may need this if you have certain conditions or if you travel or work in places where you may be exposed to hepatitis B.  Haemophilus influenzae type b (Hib) vaccine. You may need this if you have certain conditions.  Talk to your health care provider about which screenings and vaccines you need and how  often you need them. This information is not intended to replace advice given to you by your health care provider. Make sure you discuss any questions you have with your health care provider. Document Released: 06/23/2015 Document Revised: 02/14/2016 Document Reviewed: 03/28/2015 Elsevier Interactive Patient Education  2018 [employer].     No follow-ups on file.  Lucretia Kern, DO

## 2017-10-28 ENCOUNTER — Ambulatory Visit (INDEPENDENT_AMBULATORY_CARE_PROVIDER_SITE_OTHER): Payer: 59 | Admitting: Family Medicine

## 2017-10-28 ENCOUNTER — Encounter: Payer: 59 | Admitting: Family Medicine

## 2017-10-28 ENCOUNTER — Encounter: Payer: Self-pay | Admitting: Family Medicine

## 2017-10-28 VITALS — BP 80/60 | HR 51 | Temp 98.1°F | Ht 62.75 in | Wt 117.7 lb

## 2017-10-28 DIAGNOSIS — Z0000 Encounter for general adult medical examination without abnormal findings: Secondary | ICD-10-CM

## 2017-10-28 DIAGNOSIS — Z1331 Encounter for screening for depression: Secondary | ICD-10-CM | POA: Diagnosis not present

## 2017-10-28 DIAGNOSIS — J4530 Mild persistent asthma, uncomplicated: Secondary | ICD-10-CM

## 2017-10-28 LAB — LIPID PANEL
Cholesterol: 126 mg/dL (ref 0–200)
HDL: 64.9 mg/dL (ref >39.00)
LDL Cholesterol: 53 mg/dL (ref 0–99)
NonHDL: 60.95
Total CHOL/HDL Ratio: 2
Triglycerides: 38 mg/dL (ref 0.0–149.0)
VLDL: 7.6 mg/dL (ref 0.0–40.0)

## 2017-10-28 LAB — HEMOGLOBIN A1C: Hgb A1c MFr Bld: 4.5 % — ABNORMAL LOW (ref 4.6–6.5)

## 2017-10-28 NOTE — Patient Instructions (Signed)
BEFORE YOU LEAVE: -labs -follow up: yearly or as needed for CPE  We have ordered labs or studies at this visit. It can take up to 1-2 weeks for results and processing. IF results require follow up or explanation, we will call you with instructions. Clinically stable results will be released to your [facility]. If you have not heard from [place] or cannot find your results in [facility] in 2 weeks please contact our office at [phone].  If you are not yet signed up for [facility], please consider signing up.       Preventive Care 40-64 Years, Female Preventive care refers to lifestyle choices and visits with your health care provider that can promote health and wellness. What does preventive care include?  A yearly physical exam. This is also called an annual well check.  Dental exams once or twice a year.  Routine eye exams. Ask your health care provider how often you should have your eyes checked.  Personal lifestyle choices, including: ? Daily care of your teeth and gums. ? Regular physical activity. ? Eating a healthy diet. ? Avoiding tobacco and drug use. ? Limiting alcohol use. ? Practicing safe sex. ? Taking low-dose aspirin daily starting at age 70. ? Taking vitamin and mineral supplements as recommended by your health care provider. What happens during an annual well check? The services and screenings done by your health care provider during your annual well check will depend on your age, overall health, lifestyle risk factors, and family history of disease. Counseling Your health care provider may ask you questions about your:  Alcohol use.  Tobacco use.  Drug use.  Emotional well-being.  Home and relationship well-being.  Sexual activity.  Eating habits.  Work and work Statistician.  Method of birth control.  Menstrual cycle.  Pregnancy history.  Screening You may have the following tests or measurements:  Height, weight, and BMI.  Blood  pressure.  Lipid and cholesterol levels. These may be checked every 5 years, or more frequently if you are over 23 years old.  Skin check.  Lung cancer screening. You may have this screening every year starting at age 27 if you have a 30-pack-year history of smoking and currently smoke or have quit within the past 15 years.  Fecal occult blood test (FOBT) of the stool. You may have this test every year starting at age 42.  Flexible sigmoidoscopy or colonoscopy. You may have a sigmoidoscopy every 5 years or a colonoscopy every 10 years starting at age 81.  Hepatitis C blood test.  Hepatitis B blood test.  Sexually transmitted disease (STD) testing.  Diabetes screening. This is done by checking your blood sugar (glucose) after you have not eaten for a while (fasting). You may have this done every 1-3 years.  Mammogram. This may be done every 1-2 years. Talk to your health care provider about when you should start having regular mammograms. This may depend on whether you have a family history of breast cancer.  BRCA-related cancer screening. This may be done if you have a family history of breast, ovarian, tubal, or peritoneal cancers.  Pelvic exam and Pap test. This may be done every 3 years starting at age 80. Starting at age 85, this may be done every 5 years if you have a Pap test in combination with an HPV test.  Bone density scan. This is done to screen for osteoporosis. You may have this scan if you are at high risk for osteoporosis.  Discuss your  test results, treatment options, and if necessary, the need for more tests with your health care provider. Vaccines Your health care provider may recommend certain vaccines, such as:  Influenza vaccine. This is recommended every year.  Tetanus, diphtheria, and acellular pertussis (Tdap, Td) vaccine. You may need a Td booster every 10 years.  Varicella vaccine. You may need this if you have not been vaccinated.  Zoster vaccine. You  may need this after age 58.  Measles, mumps, and rubella (MMR) vaccine. You may need at least one dose of MMR if you were born in 1957 or later. You may also need a second dose.  Pneumococcal 13-valent conjugate (PCV13) vaccine. You may need this if you have certain conditions and were not previously vaccinated.  Pneumococcal polysaccharide (PPSV23) vaccine. You may need one or two doses if you smoke cigarettes or if you have certain conditions.  Meningococcal vaccine. You may need this if you have certain conditions.  Hepatitis A vaccine. You may need this if you have certain conditions or if you travel or work in places where you may be exposed to hepatitis A.  Hepatitis B vaccine. You may need this if you have certain conditions or if you travel or work in places where you may be exposed to hepatitis B.  Haemophilus influenzae type b (Hib) vaccine. You may need this if you have certain conditions.  Talk to your health care provider about which screenings and vaccines you need and how often you need them. This information is not intended to replace advice given to you by your health care provider. Make sure you discuss any questions you have with your health care provider. Document Released: 06/23/2015 Document Revised: 02/14/2016 Document Reviewed: 03/28/2015 Elsevier Interactive Patient Education  [employer].

## 2017-10-30 DIAGNOSIS — Z01419 Encounter for gynecological examination (general) (routine) without abnormal findings: Secondary | ICD-10-CM | POA: Diagnosis not present

## 2017-10-30 DIAGNOSIS — Z6820 Body mass index (BMI) 20.0-20.9, adult: Secondary | ICD-10-CM | POA: Diagnosis not present

## 2018-02-26 DIAGNOSIS — Z30433 Encounter for removal and reinsertion of intrauterine contraceptive device: Secondary | ICD-10-CM | POA: Diagnosis not present

## 2018-02-26 DIAGNOSIS — Z3202 Encounter for pregnancy test, result negative: Secondary | ICD-10-CM | POA: Diagnosis not present

## 2018-09-01 ENCOUNTER — Telehealth: Payer: 59 | Admitting: Physician Assistant

## 2018-09-01 DIAGNOSIS — R509 Fever, unspecified: Secondary | ICD-10-CM

## 2018-09-01 DIAGNOSIS — Z20828 Contact with and (suspected) exposure to other viral communicable diseases: Secondary | ICD-10-CM

## 2018-09-01 DIAGNOSIS — Z20822 Contact with and (suspected) exposure to covid-19: Secondary | ICD-10-CM

## 2018-09-01 NOTE — Progress Notes (Signed)
I have spent 5 minutes in review of e-visit questionnaire, review and updating patient chart, medical decision making and response to patient.   Nasire Reali Cody Archit Leger, PA-C    

## 2018-09-01 NOTE — Progress Notes (Signed)
E-Visit for Corona Virus Screening  From what we have been told, in your case you would need to contact [facility] [phone]) with [facility] who would do testing and determine length of quarantine. They have had some differing rules for providers (Which seem to be updated daily). They will likely require your husbands quarantine as well to monitor for symptoms while waiting on your test results. Make sure to message your practice administrator to make them aware of your symptoms.  Coronavirus disease 2019 (COVID-19) is a respiratory illness that can spread from person to person. The virus that causes COVID-19 is a new virus that was first identified in the country of [place] but is now found in multiple other countries and has spread to the [place].  Symptoms associated with the virus are mild to severe fever, cough, and shortness of breath. There is currently no vaccine to protect against COVID-19, and there is no specific antiviral treatment for the virus.   To be considered HIGH RISK for Coronavirus (COVID-19), you have to meet the following criteria:  . Traveled to [place], [place], [place], [place] or [place]; or in the [place] to [place], [place], [place], or [place]; and have fever, cough, and shortness of breath within the last 2 weeks of travel OR  . Been in close contact with a person diagnosed with COVID-19 within the last 2 weeks and have fever, cough, and shortness of breath  . IF YOU DO NOT MEET THESE CRITERIA, YOU ARE CONSIDERED LOW RISK FOR COVID-19.   It is vitally important that if you feel that you have an infection such as this virus or any other virus that you stay home and away from places where you may spread it to others.  You should self-quarantine for 14 days if you have symptoms that could potentially be coronavirus and avoid contact with people age 42 and older.   You can use medication such as Delsym for cough.  You may also take acetaminophen (Tylenol)  as needed for fever.   Reduce your risk of any infection by using the same precautions used for avoiding the common cold or flu:  Marland Kitchen Wash your hands often with soap and warm water for at least 20 seconds.  If soap and water are not readily available, use an alcohol-based hand sanitizer with at least 60% alcohol.  . If coughing or sneezing, cover your mouth and nose by coughing or sneezing into the elbow areas of your shirt or coat, into a tissue or into your sleeve (not your hands). . Avoid shaking hands with others and consider head nods or verbal greetings only. . Avoid touching your eyes, nose, or mouth with unwashed hands.  . Avoid close contact with people who are sick. . Avoid places or events with large numbers of people in one location, like concerts or sporting events. . Carefully consider travel plans you have or are making. . If you are planning any travel outside or inside the [place], visit the CDC's Travelers' Health webpage for the latest health notices. . If you have some symptoms but not all symptoms, continue to monitor at home and seek medical attention if your symptoms worsen. . If you are having a medical emergency, call 911.  HOME CARE . Only take medications as instructed by your medical team. . Drink plenty of fluids and get plenty of rest. . A steam or ultrasonic humidifier can help if you have congestion.   GET HELP RIGHT  AWAY IF: . You develop worsening fever. . You become short of breath . You cough up blood. . Your symptoms become more severe MAKE SURE YOU   Understand these instructions.  Will watch your condition.  Will get help right away if you are not doing well or get worse.  Your e-visit answers were reviewed by a board certified advanced clinical practitioner to complete your personal care plan.  Depending on the condition, your plan could have included both over the counter or prescription medications.  If there is a problem please reply once you have  received a response from your provider. Your safety is important to [place].  If you have drug allergies check your prescription carefully.    You can use MyChart to ask questions about today's visit, request a non-urgent call back, or ask for a work or school excuse for 24 hours related to this e-Visit. If it has been greater than 24 hours you will need to follow up with your provider, or enter a new e-Visit to address those concerns. You will get an e-mail in the next two days asking about your experience.  I hope that your e-visit has been valuable and will speed your recovery. Thank you for using e-visits.

## 2018-09-02 DIAGNOSIS — Z03818 Encounter for observation for suspected exposure to other biological agents ruled out: Secondary | ICD-10-CM | POA: Diagnosis not present

## 2018-09-02 DIAGNOSIS — R509 Fever, unspecified: Secondary | ICD-10-CM | POA: Diagnosis not present

## 2018-09-02 DIAGNOSIS — R05 Cough: Secondary | ICD-10-CM | POA: Diagnosis not present

## 2018-10-28 DIAGNOSIS — M4722 Other spondylosis with radiculopathy, cervical region: Secondary | ICD-10-CM | POA: Diagnosis not present

## 2018-10-28 DIAGNOSIS — M542 Cervicalgia: Secondary | ICD-10-CM | POA: Diagnosis not present

## 2018-10-29 DIAGNOSIS — M5412 Radiculopathy, cervical region: Secondary | ICD-10-CM | POA: Diagnosis not present

## 2018-11-04 DIAGNOSIS — M5412 Radiculopathy, cervical region: Secondary | ICD-10-CM | POA: Diagnosis not present

## 2018-11-09 DIAGNOSIS — M5412 Radiculopathy, cervical region: Secondary | ICD-10-CM | POA: Diagnosis not present

## 2018-11-12 DIAGNOSIS — M5412 Radiculopathy, cervical region: Secondary | ICD-10-CM | POA: Diagnosis not present

## 2018-11-13 DIAGNOSIS — M5412 Radiculopathy, cervical region: Secondary | ICD-10-CM | POA: Diagnosis not present

## 2018-11-13 DIAGNOSIS — M4802 Spinal stenosis, cervical region: Secondary | ICD-10-CM | POA: Diagnosis not present

## 2018-11-13 DIAGNOSIS — M5020 Other cervical disc displacement, unspecified cervical region: Secondary | ICD-10-CM | POA: Diagnosis not present

## 2018-11-13 DIAGNOSIS — M542 Cervicalgia: Secondary | ICD-10-CM | POA: Diagnosis not present

## 2018-11-16 DIAGNOSIS — M5412 Radiculopathy, cervical region: Secondary | ICD-10-CM | POA: Diagnosis not present

## 2018-11-30 DIAGNOSIS — M5412 Radiculopathy, cervical region: Secondary | ICD-10-CM | POA: Diagnosis not present

## 2018-12-07 DIAGNOSIS — M5412 Radiculopathy, cervical region: Secondary | ICD-10-CM | POA: Diagnosis not present

## 2018-12-07 DIAGNOSIS — M5020 Other cervical disc displacement, unspecified cervical region: Secondary | ICD-10-CM | POA: Diagnosis not present

## 2018-12-07 DIAGNOSIS — M4802 Spinal stenosis, cervical region: Secondary | ICD-10-CM | POA: Diagnosis not present

## 2018-12-07 DIAGNOSIS — M542 Cervicalgia: Secondary | ICD-10-CM | POA: Diagnosis not present

## 2018-12-14 DIAGNOSIS — M5412 Radiculopathy, cervical region: Secondary | ICD-10-CM | POA: Diagnosis not present

## 2018-12-16 DIAGNOSIS — M5412 Radiculopathy, cervical region: Secondary | ICD-10-CM | POA: Diagnosis not present

## 2018-12-22 DIAGNOSIS — M5412 Radiculopathy, cervical region: Secondary | ICD-10-CM | POA: Diagnosis not present

## 2018-12-28 DIAGNOSIS — M4802 Spinal stenosis, cervical region: Secondary | ICD-10-CM | POA: Diagnosis not present

## 2018-12-28 DIAGNOSIS — M5020 Other cervical disc displacement, unspecified cervical region: Secondary | ICD-10-CM | POA: Diagnosis not present

## 2018-12-28 DIAGNOSIS — M542 Cervicalgia: Secondary | ICD-10-CM | POA: Diagnosis not present

## 2018-12-28 DIAGNOSIS — M5412 Radiculopathy, cervical region: Secondary | ICD-10-CM | POA: Diagnosis not present

## 2018-12-29 DIAGNOSIS — M5412 Radiculopathy, cervical region: Secondary | ICD-10-CM | POA: Diagnosis not present

## 2018-12-31 DIAGNOSIS — M5412 Radiculopathy, cervical region: Secondary | ICD-10-CM | POA: Diagnosis not present

## 2019-04-15 DIAGNOSIS — C44519 Basal cell carcinoma of skin of other part of trunk: Secondary | ICD-10-CM | POA: Diagnosis not present

## 2019-04-15 DIAGNOSIS — D485 Neoplasm of uncertain behavior of skin: Secondary | ICD-10-CM | POA: Diagnosis not present

## 2019-05-28 ENCOUNTER — Institutional Professional Consult (permissible substitution): Payer: 59 | Admitting: Plastic Surgery

## 2019-06-18 ENCOUNTER — Encounter: Payer: Self-pay | Admitting: Plastic Surgery

## 2019-06-18 ENCOUNTER — Institutional Professional Consult (permissible substitution): Payer: 59 | Admitting: Plastic Surgery

## 2019-06-18 ENCOUNTER — Ambulatory Visit (INDEPENDENT_AMBULATORY_CARE_PROVIDER_SITE_OTHER): Payer: 59 | Admitting: Plastic Surgery

## 2019-06-18 ENCOUNTER — Other Ambulatory Visit: Payer: Self-pay

## 2019-06-18 DIAGNOSIS — C4491 Basal cell carcinoma of skin, unspecified: Secondary | ICD-10-CM | POA: Insufficient documentation

## 2019-06-18 DIAGNOSIS — Z719 Counseling, unspecified: Secondary | ICD-10-CM

## 2019-06-18 DIAGNOSIS — C44511 Basal cell carcinoma of skin of breast: Secondary | ICD-10-CM | POA: Diagnosis not present

## 2019-06-18 NOTE — Progress Notes (Addendum)
   Subjective:    Patient ID: Krystal Butler, female    DOB: 1976-02-08, 44 y.o.   MRN: [id number]  The patient is a 44 year old female here for evaluation of her skin.  She had a changing skin lesion on her right breast.  She was seen by the dermatologist and a biopsy was done it appears to be a shave biopsy.  According to the patient it came back as a basal cell carcinoma invasive.  The biopsy site seems to have healed nicely.  It is 4 mm in size.        Review of Systems  Constitutional: Negative for activity change.  Respiratory: Negative for shortness of breath.   Cardiovascular: Negative for leg swelling.  Gastrointestinal: Negative for abdominal pain.       Objective:   Physical Exam Constitutional:      Appearance: Normal appearance.  HENT:     Head: Normocephalic and atraumatic.  Cardiovascular:     Rate and Rhythm: Normal rate.     Pulses: Normal pulses.  Pulmonary:     Effort: Pulmonary effort is normal. No respiratory distress.  Chest:    Abdominal:     General: Abdomen is flat. There is no distension.     Tenderness: There is no abdominal tenderness.  Musculoskeletal:        General: No swelling.  Skin:    General: Skin is warm.     Capillary Refill: Capillary refill takes less than 2 seconds.  Neurological:     General: No focal deficit present.     Mental Status: She is alert and oriented to person, place, and time.  Psychiatric:        Mood and Affect: Mood normal.        Behavior: Behavior normal.        Thought Content: Thought content normal.       Assessment & Plan:     ICD-10-CM   1. Basal cell carcinoma (BCC) of skin of right breast  C44.511     We discussed the different options for excision.  At this site I think that there is room for the patient making a decision.  Mohs surgery is always a nice option for immediate results of the margins.  She is wanting this to be done and a expeditious manner.  For that reason we can go ahead and  excise it.  We will send for pathology.  The down side is that if the margins come back positive a reexcision will be needed.  The patient is comfortable with that and would like to proceed.  Pictures were obtained of the patient and placed in the chart with the patient's or guardian's permission.  The [place] was signed into law in 2016 which includes the topic of electronic health records.  This provides immediate access to information in MyChart.  This includes consultation notes, operative notes, office notes, lab results and pathology reports.  If you have any questions about what you read please let us know at your next visit or call us at the office.  We are right here with you.

## 2019-07-05 ENCOUNTER — Ambulatory Visit (INDEPENDENT_AMBULATORY_CARE_PROVIDER_SITE_OTHER): Payer: Self-pay

## 2019-07-05 ENCOUNTER — Other Ambulatory Visit: Payer: Self-pay

## 2019-07-05 DIAGNOSIS — Z719 Counseling, unspecified: Secondary | ICD-10-CM

## 2019-07-05 NOTE — Patient Instructions (Signed)
Preoperative Dx: facial aging  Postoperative Dx:  same  Procedure: laser to face  Anesthesia: none  Description of Procedure:  Risks and complications were explained to the patient. Consent was confirmed and signed. Time out was called and all information was confirmed to be correct. The area  was prepped with alcohol and wiped dry. The laser was set at  skin -2/ suntan light 7.0 - cheeks & decreased to 6.2- around mouth/forehead & eye area The entire face was lasered.  Gel removed & aloe applied The patient tolerated the procedure well and there were no complications.  She [place] reminded to protect skin with sunscreen & moisturizer & avoid retina products & no abrasive scrubs for approx. 5 -7 days  Patient to f/u in 4 weeks- she plans to have Frax laser at her next appointment I informed her that Dr. Marla Roe will order RX: EMLA cream for Frax procedure I instructed her to apply EMLA to face 30 mins prior to procedure She will call for any concerns [facility]

## 2019-07-05 NOTE — Progress Notes (Signed)
Preoperative Dx: facial aging  Postoperative Dx:  same  Procedure: laser to face  Anesthesia: none  Description of Procedure:  Risks and complications were explained to the patient. Consent was confirmed and signed. Time out was called and all information was confirmed to be correct. The area  was prepped with alcohol and wiped dry. The laser was set at  skin -2/ suntan light 7.0 - cheeks & decreased to 6.2- around mouth/forehead & eye area The entire face was lasered.  Gel removed & aloe applied The patient tolerated the procedure well and there were no complications.  She [place] reminded to protect skin with sunscreen & moisturizer & avoid retina products & no abrasive scrubs for approx. 5 -7 days  Patient to f/u in 4 weeks- she plans to have Frax laser at her next appointment I informed her that Dr. Marla Roe will order RX: EMLA cream for Frax procedure I instructed her to apply EMLA to face 30 mins prior to procedure She will call for any concerns

## 2019-07-06 ENCOUNTER — Telehealth: Payer: Self-pay

## 2019-07-06 MED ORDER — LIDOCAINE-PRILOCAINE 2.5-2.5 % EX CREA: 1.0000 "application " | TOPICAL_CREAM | CUTANEOUS | 0 refills | Status: DC | PRN

## 2019-07-06 NOTE — Telephone Encounter (Signed)
EMLA sent in for laser

## 2019-07-22 DIAGNOSIS — Z03818 Encounter for observation for suspected exposure to other biological agents ruled out: Secondary | ICD-10-CM | POA: Diagnosis not present

## 2019-07-29 ENCOUNTER — Telehealth: Payer: Self-pay | Admitting: Plastic Surgery

## 2019-07-29 NOTE — Telephone Encounter (Signed)

## 2019-07-30 ENCOUNTER — Other Ambulatory Visit: Payer: Self-pay

## 2019-07-30 ENCOUNTER — Encounter: Payer: Self-pay | Admitting: Plastic Surgery

## 2019-07-30 ENCOUNTER — Other Ambulatory Visit (HOSPITAL_COMMUNITY)
Admission: RE | Admit: 2019-07-30 | Discharge: 2019-07-30 | Disposition: A | Discharge status: Home / Self Care | Payer: 59 | Source: Ambulatory Visit | Attending: Plastic Surgery | Admitting: Plastic Surgery

## 2019-07-30 ENCOUNTER — Ambulatory Visit (INDEPENDENT_AMBULATORY_CARE_PROVIDER_SITE_OTHER): Payer: 59 | Admitting: Plastic Surgery

## 2019-07-30 VITALS — BP 110/64 | HR 44 | Temp 97.7°F | Ht 63.0 in | Wt 121.8 lb

## 2019-07-30 DIAGNOSIS — C44511 Basal cell carcinoma of skin of breast: Secondary | ICD-10-CM | POA: Diagnosis not present

## 2019-07-30 DIAGNOSIS — L905 Scar conditions and fibrosis of skin: Secondary | ICD-10-CM | POA: Diagnosis not present

## 2019-07-30 NOTE — Progress Notes (Signed)
Procedure Note  Preoperative Dx: BCC chest / right breast  Postoperative Dx: Same  Procedure: Excision of BCC chest / right breast .7 x 1.5 cm  Anesthesia: Lidocaine 1% with 1:100,000 epinepherine  Indication for Procedure: [facility]  Description of Procedure: Risks and complications were explained to the patient.  Consent was confirmed and the patient understands the risks and benefits.  The potential complications and alternatives were explained and the patient consents.  The patient expressed understanding the option of not having the procedure and the risks of a scar.  Time out was called and all information was confirmed to be correct.    The area was prepped and drapped.  Lidocaine 1% with epinepherine was injected in the subcutaneous area.  After waiting several minutes for the local to take affect a #15 blade was used to excise the area in an eliptical pattern with 108mm borders from the biopsy site.  A 4-0 Monocryl was used to close the deep layers with simple interrupted stitches.  The skin edges were reapproximated with 5-0 Monocryl subcuticular running closure.  A dressing was applied.  The patient was given instructions on how to care for the area and a follow up appointment.  Krystal Butler tolerated the procedure well and there were no complications. The specimen was marked long stitch left and short superior then sent to pathology with the biopsy report from the referring physician.

## 2019-08-02 LAB — SURGICAL PATHOLOGY

## 2019-08-12 DIAGNOSIS — D485 Neoplasm of uncertain behavior of skin: Secondary | ICD-10-CM | POA: Diagnosis not present

## 2019-08-12 DIAGNOSIS — D1801 Hemangioma of skin and subcutaneous tissue: Secondary | ICD-10-CM | POA: Diagnosis not present

## 2019-08-12 DIAGNOSIS — D225 Melanocytic nevi of trunk: Secondary | ICD-10-CM | POA: Diagnosis not present

## 2019-08-12 DIAGNOSIS — D2271 Melanocytic nevi of right lower limb, including hip: Secondary | ICD-10-CM | POA: Diagnosis not present

## 2019-08-12 DIAGNOSIS — L821 Other seborrheic keratosis: Secondary | ICD-10-CM | POA: Diagnosis not present

## 2019-08-12 DIAGNOSIS — L988 Other specified disorders of the skin and subcutaneous tissue: Secondary | ICD-10-CM | POA: Diagnosis not present

## 2019-08-12 DIAGNOSIS — L905 Scar conditions and fibrosis of skin: Secondary | ICD-10-CM | POA: Diagnosis not present

## 2019-08-12 DIAGNOSIS — Z85828 Personal history of other malignant neoplasm of skin: Secondary | ICD-10-CM | POA: Diagnosis not present

## 2019-08-13 ENCOUNTER — Encounter: Payer: Self-pay | Admitting: Plastic Surgery

## 2019-08-13 ENCOUNTER — Other Ambulatory Visit: Payer: Self-pay

## 2019-08-13 ENCOUNTER — Ambulatory Visit (INDEPENDENT_AMBULATORY_CARE_PROVIDER_SITE_OTHER): Payer: 59 | Admitting: Plastic Surgery

## 2019-08-13 VITALS — BP 117/77 | HR 68 | Temp 97.3°F | Ht 63.0 in | Wt 121.2 lb

## 2019-08-13 DIAGNOSIS — Z719 Counseling, unspecified: Secondary | ICD-10-CM

## 2019-08-13 NOTE — Progress Notes (Signed)
The patient is a 44 year old excision of a skin cancer.  Margins are all negative.  The chest incision is very nicely.  No sign of infection.  Sutures were removed.  Steri-Strip was applied. Recommend keeping the Steri-Strip on for 1 week.

## 2019-08-23 ENCOUNTER — Other Ambulatory Visit: Payer: Self-pay

## 2019-08-23 ENCOUNTER — Ambulatory Visit (INDEPENDENT_AMBULATORY_CARE_PROVIDER_SITE_OTHER): Payer: Self-pay

## 2019-08-23 VITALS — BP 109/70 | HR 56 | Temp 98.4°F | Ht 63.0 in

## 2019-08-23 DIAGNOSIS — Z719 Counseling, unspecified: Secondary | ICD-10-CM

## 2019-08-23 DIAGNOSIS — L908 Other atrophic disorders of skin: Secondary | ICD-10-CM

## 2019-08-23 NOTE — Progress Notes (Signed)
Preoperative Dx: facial aging  Postoperative Dx:  same  Procedure: laser to face  Anesthesia: EMLA -pt applied 40 mins prior to procedure  Description of Procedure:  Risks and complications were explained to the patient. Consent was confirmed and signed. Time out was called and all information was confirmed to be correct. The area was prepped with alcohol and wiped dry. The FRAX laser was set at the following:  skin -2/ suntan light -  38.0/25.0 & 3 passes Decreased to 36.0 around mouth/forehead & eye area The entire face was lasered The patient tolerated the procedure well and there were no complications.  Aloe applied & provided ice for pt to use She is reminded to protect skin with sunscreen & moisturizer & avoid retina products & no abrasive scrubs for approx. 7 to 10 days  She is reminded that she may experience redness/peeling & irritation Patient to f/u in 6 weeks- she plans to have a 2nd Frax laser procedure at her next appointment  She will call for any concerns [place]

## 2019-10-04 ENCOUNTER — Other Ambulatory Visit: Payer: 59

## 2019-10-07 ENCOUNTER — Other Ambulatory Visit: Payer: 59

## 2019-10-14 ENCOUNTER — Ambulatory Visit (INDEPENDENT_AMBULATORY_CARE_PROVIDER_SITE_OTHER): Payer: 59

## 2019-10-14 ENCOUNTER — Other Ambulatory Visit: Payer: Self-pay

## 2019-10-14 DIAGNOSIS — Z719 Counseling, unspecified: Secondary | ICD-10-CM

## 2019-11-10 NOTE — Progress Notes (Signed)
Pt rescheduled for Sept.

## 2019-11-15 ENCOUNTER — Other Ambulatory Visit: Payer: 59

## 2020-02-20 ENCOUNTER — Ambulatory Visit: Payer: 59 | Attending: Internal Medicine

## 2020-02-20 ENCOUNTER — Other Ambulatory Visit: Payer: Self-pay

## 2020-02-20 DIAGNOSIS — Z23 Encounter for immunization: Secondary | ICD-10-CM

## 2020-02-20 NOTE — Progress Notes (Signed)
   Covid-19 Vaccination Clinic  Name:  AYLEEN MCKINSTRY    MRN: [id number] DOB: 02-23-1976  02/20/2020  Ms. Maxham was observed post Covid-19 immunization for 15 minutes without incident. She was provided with Vaccine Information Sheet and instruction to access the V-Safe system.   Ms. Blecha was instructed to call 911 with any severe reactions post vaccine: Marland Kitchen Difficulty breathing  . Swelling of face and throat  . A fast heartbeat  . A bad rash all over body  . Dizziness and weakness

## 2020-02-24 ENCOUNTER — Other Ambulatory Visit: Payer: 59

## 2020-02-24 ENCOUNTER — Other Ambulatory Visit: Payer: Self-pay

## 2020-02-24 ENCOUNTER — Ambulatory Visit (INDEPENDENT_AMBULATORY_CARE_PROVIDER_SITE_OTHER): Payer: Self-pay

## 2020-02-24 VITALS — BP 111/66 | HR 50 | Temp 98.2°F | Ht 63.0 in | Wt 122.0 lb

## 2020-02-24 DIAGNOSIS — Z719 Counseling, unspecified: Secondary | ICD-10-CM

## 2020-02-24 NOTE — Progress Notes (Signed)
Preoperative Dx: facial aging  Postoperative Dx:  same  Procedure: laser to face  Anesthesia: no EMLA had been prescribed to pt for use prior to procedure  Description of Procedure:  Risks and complications were explained to the patient. Consent was confirmed and signed. Time out was called and all information was confirmed to be correct. The area  was prepped with alcohol and wiped dry.  The FRAX laser was set at the following:  skin -2/ suntan light -  38/25 with 4 passes Decreased to 26/20  for forehead/mouth  & eye area  The entire face was lasered The patient tolerated the procedure well and there were no complications.   Aloe & post laser balm  applied after procedure She is reminded to protect skin with sunscreen & moisturizer & avoid retina products & no abrasive scrubs for approx. 10 days  She is reminded that she may experience redness/peeling & irritation Patient to f/u in 6 weeks- she plans to have another FRAX laser procedure at her next appointment She will call for any concerns [facility]

## 2020-02-24 NOTE — Patient Instructions (Signed)
Pt will f/u in 6 weeks for another FRAX laser She is reminded to use moisturizer & protect from sun Call for any concerns

## 2020-03-02 ENCOUNTER — Other Ambulatory Visit: Payer: 59

## 2020-04-06 ENCOUNTER — Other Ambulatory Visit: Payer: 59

## 2020-04-13 DIAGNOSIS — Z0000 Encounter for general adult medical examination without abnormal findings: Secondary | ICD-10-CM | POA: Diagnosis not present

## 2020-05-18 ENCOUNTER — Other Ambulatory Visit: Payer: Self-pay

## 2020-05-18 ENCOUNTER — Ambulatory Visit (INDEPENDENT_AMBULATORY_CARE_PROVIDER_SITE_OTHER): Payer: Self-pay

## 2020-05-18 VITALS — BP 115/79 | HR 68 | Temp 98.1°F | Ht 63.0 in | Wt 122.0 lb

## 2020-05-18 DIAGNOSIS — Z719 Counseling, unspecified: Secondary | ICD-10-CM

## 2020-06-22 ENCOUNTER — Other Ambulatory Visit: Payer: Self-pay

## 2020-06-22 ENCOUNTER — Ambulatory Visit (INDEPENDENT_AMBULATORY_CARE_PROVIDER_SITE_OTHER): Payer: Self-pay

## 2020-06-22 VITALS — BP 109/70 | HR 52 | Temp 97.8°F

## 2020-06-22 DIAGNOSIS — Z719 Counseling, unspecified: Secondary | ICD-10-CM

## 2020-06-22 NOTE — Progress Notes (Signed)
Preoperative Dx: facial aging  Postoperative Dx:  same  Procedure: laser to face  EMLA applied 45 minutes prior to procedure  Description of Procedure:  Risks and complications were explained to the patient. Consent was confirmed and signed. Time out was called and all information was confirmed to be correct. The area was prepped with alcohol and wiped dry.  The FRAX laser was set at the following:  skin -2/ suntan light -  36.0/25.0 with 4 passes. The entire face was lasered The patient tolerated the procedure well and there were no complications.   Aloe & post laser balm  applied after procedure She is reminded to protect skin with sunscreen & moisturizer & avoid retina products & no abrasive scrubs for approx. 10 days  She is reminded that she may experience redness/peeling & irritation. She will f/u in 6 weeks if needed. She will call for any concerns.

## 2020-06-22 NOTE — Patient Instructions (Signed)
Pt will use moisturizer/sunscreen. She understands that she may experience red/irritation & peeling/sloughing of skin. She will f/u as needed, and call for any concerns. She understands that she has completed a series of 3 FRAX laser sessions & she will assess her face/skin & decide if any further FRAX treatments are necessary after approx 3 months. She is reminded to routinely use appropriate skin care to maintain/improve skin texture/complexion following these treatments.

## 2020-08-03 NOTE — Patient Instructions (Signed)
Pt is reminded to use sunscreen/moisturizer.  Avoid retinol products for approx 10 days She may have redness/ dry/irritation & slight peeling. She will f/u in 6 weeks if needed.

## 2020-08-03 NOTE — Progress Notes (Signed)
05/18/20 Preoperative Dx: facial aging  Postoperative Dx:  same  Procedure: laser to face  Anesthesia: EMLA -pt applied 30 mins prior to procedure  Description of Procedure:  Risks and complications were explained to the patient. Consent was confirmed and signed. Time out was called and all information was confirmed to be correct. The area  was prepped with alcohol and wiped dry.  The FRAX laser was set at the following:  skin -2/ suntan light -  38.0/25.0 & 4 passes Decreased to 35.0 around mouth/forehead & eye area The entire face was lasered The patient tolerated the procedure well and there were no complications.  Aloe applied   She will call for any concerns

## 2021-04-12 ENCOUNTER — Ambulatory Visit (INDEPENDENT_AMBULATORY_CARE_PROVIDER_SITE_OTHER): Payer: Self-pay | Admitting: Surgical

## 2021-04-12 ENCOUNTER — Other Ambulatory Visit (HOSPITAL_COMMUNITY): Payer: Self-pay

## 2021-04-12 ENCOUNTER — Other Ambulatory Visit: Payer: Self-pay

## 2021-04-12 DIAGNOSIS — Z719 Counseling, unspecified: Secondary | ICD-10-CM

## 2021-04-12 DIAGNOSIS — L908 Other atrophic disorders of skin: Secondary | ICD-10-CM

## 2021-04-12 MED ORDER — LIDOCAINE 23% - TETRACAINE 7% TOPICAL OINTMENT (PLASTICIZED)
1.0000 "application " | TOPICAL_OINTMENT | Freq: Once | CUTANEOUS | 0 refills | Status: AC
  Filled 2021-04-12: qty 60, 30d supply, fill #0
  Filled 2021-05-14: qty 50, 1d supply, fill #0

## 2021-04-12 NOTE — Progress Notes (Signed)
Patient is a 45 year old female who presents today for fractional laser treatment for fine lines and wrinkles.  She feels as if her last treatment was helpful, she is interested in another treatment.  After brief discussion with the patient, I did mention to her the Halo laser may have a more beneficial impact on her fine lines and wrinkles.  We discussed the pros and cons of fracture versus halo and she seems interested in halo for a variety of reasons.  All of her questions were answered in regards to this.  We did discuss scheduling this in the next month or so.  I discussed with the patient that I would send her a prescription for a stronger numbing medication as the 2 to 3% is not strong enough.  We will plan to see her in December.

## 2021-04-20 ENCOUNTER — Other Ambulatory Visit (HOSPITAL_COMMUNITY): Payer: Self-pay

## 2021-05-14 ENCOUNTER — Other Ambulatory Visit (HOSPITAL_COMMUNITY): Payer: Self-pay

## 2021-05-24 ENCOUNTER — Telehealth: Payer: Self-pay | Admitting: Surgical

## 2021-05-24 NOTE — Telephone Encounter (Signed)
Patient called regarding her appointment that she has scheduled for tomorrow and is beginning to get cold feet about the Halo Laser so she would appreciate someone giving her a call so she can discuss possible downtime. She said her skin reacts well to the Fraxil Laser so she was wondering if she could switch to that if she decides she isn't comfortable with the downtime for Halo Laser.  Please follow up with patient.

## 2021-05-25 ENCOUNTER — Other Ambulatory Visit: Payer: Self-pay | Admitting: Surgical

## 2021-06-14 DIAGNOSIS — Z719 Counseling, unspecified: Secondary | ICD-10-CM

## 2021-06-15 ENCOUNTER — Ambulatory Visit (INDEPENDENT_AMBULATORY_CARE_PROVIDER_SITE_OTHER): Payer: Self-pay | Admitting: Surgical

## 2021-06-15 ENCOUNTER — Other Ambulatory Visit: Payer: Self-pay

## 2021-06-15 ENCOUNTER — Other Ambulatory Visit: Payer: Self-pay | Admitting: Surgical

## 2021-06-15 ENCOUNTER — Other Ambulatory Visit: Payer: 59 | Admitting: Plastic Surgery

## 2021-06-15 DIAGNOSIS — L908 Other atrophic disorders of skin: Secondary | ICD-10-CM

## 2021-06-15 DIAGNOSIS — Z719 Counseling, unspecified: Secondary | ICD-10-CM

## 2021-06-15 NOTE — Progress Notes (Signed)
Preoperative Dx: Facial aging  Postoperative Dx:  same  Procedure: laser to face  Anesthesia: Emla applied to face 30 minutes prior to procedure  Description of Procedure:  Risks and complications were explained to the patient. Consent was confirmed. Time out was called and all information was confirmed to be correct. The area  area was prepped with alcohol and wiped dry. The fracture laser was set at the following settings: Skin-2, suntan 0, 38/25 with 4 passes  The entire face and neck were lasered, patient tolerated the procedure well and there were no complications.  She is reminded to protect the skin with sunscreen and moisturizer, avoid Retin-A products, no abrasive scrub for 10 days. She tolerated the procedure well and there were no complications.  We discussed scheduling a follow-up later in the year after summer.  Recommend calling with questions or concerns.

## 2021-07-12 DIAGNOSIS — K219 Gastro-esophageal reflux disease without esophagitis: Secondary | ICD-10-CM | POA: Diagnosis not present

## 2021-07-12 DIAGNOSIS — R7989 Other specified abnormal findings of blood chemistry: Secondary | ICD-10-CM | POA: Diagnosis not present

## 2021-07-19 DIAGNOSIS — N393 Stress incontinence (female) (male): Secondary | ICD-10-CM | POA: Diagnosis not present

## 2021-07-19 DIAGNOSIS — Z1331 Encounter for screening for depression: Secondary | ICD-10-CM | POA: Diagnosis not present

## 2021-07-19 DIAGNOSIS — Z0000 Encounter for general adult medical examination without abnormal findings: Secondary | ICD-10-CM | POA: Diagnosis not present

## 2021-07-19 DIAGNOSIS — Z1339 Encounter for screening examination for other mental health and behavioral disorders: Secondary | ICD-10-CM | POA: Diagnosis not present

## 2021-08-30 DIAGNOSIS — Z6820 Body mass index (BMI) 20.0-20.9, adult: Secondary | ICD-10-CM | POA: Diagnosis not present

## 2021-08-30 DIAGNOSIS — Z1231 Encounter for screening mammogram for malignant neoplasm of breast: Secondary | ICD-10-CM | POA: Diagnosis not present

## 2021-08-30 DIAGNOSIS — Z1151 Encounter for screening for human papillomavirus (HPV): Secondary | ICD-10-CM | POA: Diagnosis not present

## 2021-08-30 DIAGNOSIS — Z01419 Encounter for gynecological examination (general) (routine) without abnormal findings: Secondary | ICD-10-CM | POA: Diagnosis not present

## 2021-08-30 DIAGNOSIS — Z124 Encounter for screening for malignant neoplasm of cervix: Secondary | ICD-10-CM | POA: Diagnosis not present

## 2021-10-25 DIAGNOSIS — D485 Neoplasm of uncertain behavior of skin: Secondary | ICD-10-CM | POA: Diagnosis not present

## 2021-10-25 DIAGNOSIS — C44319 Basal cell carcinoma of skin of other parts of face: Secondary | ICD-10-CM | POA: Diagnosis not present

## 2021-11-15 ENCOUNTER — Institutional Professional Consult (permissible substitution): Payer: 59 | Admitting: Plastic Surgery

## 2022-01-03 DIAGNOSIS — C44319 Basal cell carcinoma of skin of other parts of face: Secondary | ICD-10-CM | POA: Diagnosis not present

## 2022-01-17 ENCOUNTER — Other Ambulatory Visit: Payer: Self-pay

## 2022-01-17 MED ORDER — TRETINOIN 0.05 % EX CREA
TOPICAL_CREAM | CUTANEOUS | 2 refills | Status: DC
  Filled 2022-01-17: qty 20, 30d supply, fill #0
  Filled 2022-01-29: qty 60, 30d supply, fill #0

## 2022-01-18 ENCOUNTER — Other Ambulatory Visit: Payer: Self-pay

## 2022-01-21 ENCOUNTER — Other Ambulatory Visit: Payer: Self-pay

## 2022-01-24 ENCOUNTER — Other Ambulatory Visit: Payer: Self-pay

## 2022-01-29 ENCOUNTER — Other Ambulatory Visit: Payer: Self-pay

## 2022-07-18 DIAGNOSIS — R7989 Other specified abnormal findings of blood chemistry: Secondary | ICD-10-CM | POA: Diagnosis not present

## 2022-07-18 DIAGNOSIS — K219 Gastro-esophageal reflux disease without esophagitis: Secondary | ICD-10-CM | POA: Diagnosis not present

## 2022-07-18 DIAGNOSIS — D7589 Other specified diseases of blood and blood-forming organs: Secondary | ICD-10-CM | POA: Diagnosis not present

## 2022-07-25 DIAGNOSIS — Z1339 Encounter for screening examination for other mental health and behavioral disorders: Secondary | ICD-10-CM | POA: Diagnosis not present

## 2022-07-25 DIAGNOSIS — Z0000 Encounter for general adult medical examination without abnormal findings: Secondary | ICD-10-CM | POA: Diagnosis not present

## 2022-07-25 DIAGNOSIS — Z1331 Encounter for screening for depression: Secondary | ICD-10-CM | POA: Diagnosis not present

## 2022-08-02 DIAGNOSIS — Z1211 Encounter for screening for malignant neoplasm of colon: Secondary | ICD-10-CM | POA: Diagnosis not present

## 2022-08-02 DIAGNOSIS — Z1212 Encounter for screening for malignant neoplasm of rectum: Secondary | ICD-10-CM | POA: Diagnosis not present

## 2022-08-13 ENCOUNTER — Encounter: Payer: Self-pay | Admitting: Gastroenterology

## 2022-08-22 DIAGNOSIS — N939 Abnormal uterine and vaginal bleeding, unspecified: Secondary | ICD-10-CM | POA: Diagnosis not present

## 2022-08-22 DIAGNOSIS — Z30431 Encounter for routine checking of intrauterine contraceptive device: Secondary | ICD-10-CM | POA: Diagnosis not present

## 2022-09-12 ENCOUNTER — Other Ambulatory Visit (HOSPITAL_COMMUNITY): Payer: Self-pay

## 2022-09-12 ENCOUNTER — Ambulatory Visit (AMBULATORY_SURGERY_CENTER): Payer: 59 | Admitting: *Deleted

## 2022-09-12 VITALS — Ht 63.0 in | Wt 118.0 lb

## 2022-09-12 DIAGNOSIS — Z1211 Encounter for screening for malignant neoplasm of colon: Secondary | ICD-10-CM

## 2022-09-12 MED ORDER — NA SULFATE-K SULFATE-MG SULF 17.5-3.13-1.6 GM/177ML PO SOLN
1.0000 | Freq: Once | ORAL | 0 refills | Status: AC
  Filled 2022-09-12: qty 354, 30d supply, fill #0
  Filled 2022-09-24 (×2): qty 354, 1d supply, fill #0

## 2022-09-12 NOTE — Progress Notes (Signed)
Pt's pre-visit is done over the phone and all paperwork (prep instructions) sent to patient. Pt's name and DOB verified at the beginning of the pre-visit. Pt denies any difficulty with ambulating.  No egg or soy allergy known to patient  No issues known to pt with past sedation with any surgeries or procedures Patient denies ever being intubated Pt has no issues moving head neck or swallowing No FH of Malignant Hyperthermia Pt is not on diet pills Pt is not on home 02  Pt is not on blood thinners  Pt denies issues with constipation  Pt is not on dialysis Pt denies any upcoming cardiac testing Pt encouraged to use to use Singlecare or Goodrx to reduce cost  Patient's chart reviewed by Osvaldo Angst CNRA prior to pre-visit and patient appropriate for the [place].  Pre-visit completed and red dot placed by patient's name on their procedure day (on provider's schedule).  . Visit by phone Pt states  weight is 118lb Instructions reviewed with pt and pt states understanding. Instructed to review again prior to procedure. Pt states they will.  Instructions sent by mail with coupon and by my chart

## 2022-09-20 ENCOUNTER — Other Ambulatory Visit (HOSPITAL_COMMUNITY): Payer: Self-pay

## 2022-09-24 ENCOUNTER — Other Ambulatory Visit: Payer: Self-pay

## 2022-09-24 ENCOUNTER — Other Ambulatory Visit (HOSPITAL_COMMUNITY): Payer: Self-pay

## 2022-10-04 ENCOUNTER — Encounter: Payer: Self-pay | Admitting: Gastroenterology

## 2022-10-04 ENCOUNTER — Ambulatory Visit: Payer: 59 | Admitting: Gastroenterology

## 2022-10-04 VITALS — BP 119/70 | HR 51 | Temp 98.2°F | Resp 17 | Ht 63.0 in | Wt 118.0 lb

## 2022-10-04 DIAGNOSIS — Z1211 Encounter for screening for malignant neoplasm of colon: Secondary | ICD-10-CM

## 2022-10-04 DIAGNOSIS — J45909 Unspecified asthma, uncomplicated: Secondary | ICD-10-CM | POA: Diagnosis not present

## 2022-10-04 DIAGNOSIS — R195 Other fecal abnormalities: Secondary | ICD-10-CM

## 2022-10-04 MED ORDER — SODIUM CHLORIDE 0.9 % IV SOLN: 500.0000 mL | Freq: Once | INTRAVENOUS | Status: DC

## 2022-10-04 NOTE — Progress Notes (Signed)
Pt's states no medical or surgical changes since previsit or office visit. 

## 2022-10-04 NOTE — Patient Instructions (Addendum)
Recommendation:- Patient has a contact number available for                            emergencies. The signs and symptoms of potential                            delayed complications were discussed with the                            patient. Return to normal activities tomorrow.                            Written discharge instructions were provided to the                            patient.                           - Resume previous diet.                           - Continue present medications.                           - Repeat colonoscopy in 10 years for surveillance.  Handout on hemorrhoids given.  YOU HAD AN ENDOSCOPIC PROCEDURE TODAY AT THE  ENDOSCOPY CENTER:   Refer to the procedure report that was given to you for any specific questions about what was found during the examination.  If the procedure report does not answer your questions, please call your gastroenterologist to clarify.  If you requested that your care partner not be given the details of your procedure findings, then the procedure report has been included in a sealed envelope for you to review at your convenience later.  YOU SHOULD EXPECT: Some feelings of bloating in the abdomen. Passage of more gas than usual.  Walking can help get rid of the air that was put into your GI tract during the procedure and reduce the bloating. If you had a lower endoscopy (such as a colonoscopy or flexible sigmoidoscopy) you may notice spotting of blood in your stool or on the toilet paper. If you underwent a bowel prep for your procedure, you may not have a normal bowel movement for a few days.  Please Note:  You might notice some irritation and congestion in your nose or some drainage.  This is from the oxygen used during your procedure.  There is no need for concern and it should clear up in a day or so.  SYMPTOMS TO REPORT IMMEDIATELY:  Following lower endoscopy (colonoscopy or flexible sigmoidoscopy):  Excessive amounts of  blood in the stool  Significant tenderness or worsening of abdominal pains  Swelling of the abdomen that is new, acute  Fever of 100F or higher  For urgent or emergent issues, a gastroenterologist can be reached at any hour by calling [phone]. Do not use MyChart messaging for urgent concerns.    DIET:  We do recommend a small meal at first, but then you may proceed to your regular diet.  Drink plenty of fluids but you should avoid alcoholic beverages for 24 hours.  ACTIVITY:  You should   plan to take it easy for the rest of today and you should NOT DRIVE or use heavy machinery until tomorrow (because of the sedation medicines used during the test).    FOLLOW UP: Our staff will call the number listed on your records the next business day following your procedure.  We will call around 7:15- 8:00 am to check on you and address any questions or concerns that you may have regarding the information given to you following your procedure. If we do not reach you, we will leave a message.     If any biopsies were taken you will be contacted by phone or by letter within the next 1-3 weeks.  Please call us at [phone] if you have not heard about the biopsies in 3 weeks.    SIGNATURES/CONFIDENTIALITY: You and/or your care partner have signed paperwork which will be entered into your electronic medical record.  These signatures attest to the fact that that the information above on your After Visit Summary has been reviewed and is understood.  Full responsibility of the confidentiality of this discharge information lies with you and/or your care-partner. 

## 2022-10-04 NOTE — Progress Notes (Signed)
[facility] History and Physical   Primary Care Physician:  Melida Quitter, MD   Reason for Procedure:  Positive Cologuard  Plan:    Colonoscopy with possible interventions as needed     HPI: Krystal Butler is a very pleasant 47 y.o. female here for colonoscopy for positive Cologuard. Denies any nausea, vomiting, abdominal pain, melena or bright red blood per rectum  The risks and benefits as well as alternatives of endoscopic procedure(s) have been discussed and reviewed. All questions answered. The patient agrees to proceed.    Past Medical History:  Diagnosis Date   Asthma    BCC (basal cell carcinoma of skin)    Dysplastic nevi    GERD (gastroesophageal reflux disease)     History reviewed. No pertinent surgical history.  Prior to Admission medications   Medication Sig Start Date End Date Taking? Authorizing Provider  fluticasone (FLONASE) 50 MCG/ACT nasal spray 2 sprays every day by nasal route.   Yes [provider]  levonorgestrel (MIRENA) 20 MCG/24HR IUD 1 each by Intrauterine route once.   Yes [provider]  tretinoin (RETIN-A) 0.05 % cream Apply  a small amount to affected area every night 08/12/19  Yes [provider]  albuterol (PROVENTIL HFA;VENTOLIN HFA) 108 (90 BASE) MCG/ACT inhaler Inhale 2 puffs into the lungs every 4 (four) hours as needed for wheezing or shortness of breath. Patient not taking: Reported on 09/12/2022 03/31/15   Terressa Koyanagi, DO    Current Outpatient Medications  Medication Sig Dispense Refill   fluticasone (FLONASE) 50 MCG/ACT nasal spray 2 sprays every day by nasal route.     levonorgestrel (MIRENA) 20 MCG/24HR IUD 1 each by Intrauterine route once.     tretinoin (RETIN-A) 0.05 % cream Apply  a small amount to affected area every night     albuterol (PROVENTIL HFA;VENTOLIN HFA) 108 (90 BASE) MCG/ACT inhaler Inhale 2 puffs into the lungs every 4 (four) hours as needed for wheezing or shortness of  breath. (Patient not taking: Reported on 09/12/2022) 1 Inhaler 2   Current Facility-Administered Medications  Medication Dose Route Frequency Provider Last Rate Last Admin   0.9 %  sodium chloride infusion  500 mL Intravenous Continuous Rennie Hack V, MD       0.9 %  sodium chloride infusion  500 mL Intravenous Once Napoleon Form, MD        Allergies as of 10/04/2022   (No Known Allergies)    Family History  Problem Relation Age of Onset   Lymphoma Mother    Ovarian cancer Maternal Grandmother    COPD Paternal Grandmother    Colon polyps Neg Hx    Colon cancer Neg Hx    Esophageal cancer Neg Hx    Rectal cancer Neg Hx    Stomach cancer Neg Hx     Social History   Socioeconomic History   Marital status: Married    Spouse name: Not on file   Number of children: 2   Years of education: Not on file   Highest education level: Not on file  Occupational History   Occupation: Pediatrician  Tobacco Use   Smoking status: Never   Smokeless tobacco: Never  Vaping Use   Vaping Use: Never used  Substance and Sexual Activity   Alcohol use: Yes    Comment: very rare   Drug use: Never   Sexual activity: Yes    Birth control/protection: I.U.D.  Other Topics Concern   Not on  file  Social History Narrative   Work or School: Full time Naval architect through [employer] Situation: lives with husband Verdon Cummins - children 5 and 8 yo      Spiritual Beliefs: Christian      Lifestyle:  Regular CV exercise; diet is healthy            Social Determinants of Health   Financial Resource Strain: Not on file  Food Insecurity: Not on file  Transportation Needs: Not on file  Physical Activity: Not on file  Stress: Not on file  Social Connections: Not on file  Intimate Partner Violence: Not on file    Review of Systems:  All other review of systems negative except as mentioned in the HPI.  Physical Exam: Vital signs in last 24 hours: Blood Pressure  (Abnormal) 110/59   Pulse 60   Temperature 98.2 F (36.8 C) (Temporal)   Height 5\' 3"  (1.6 m)   Weight 118 lb (53.5 kg)   Oxygen Saturation 100%   Body Mass Index 20.90 kg/m  General:   Alert, NAD Lungs:  Clear .   Heart:  Regular rate and rhythm Abdomen:  Soft, nontender and nondistended. Neuro/Psych:  Alert and cooperative. Normal mood and affect. A and O x 3  Reviewed labs, radiology imaging, old records and pertinent past GI work up  Patient is appropriate for planned procedure(s) and anesthesia in an ambulatory setting   K. Scherry Ran , MD [phone]

## 2022-10-04 NOTE — Op Note (Signed)
[facility] Patient Name: Krystal Butler Procedure Date: 10/04/2022 10:45 AM MRN: [id number] Endoscopist: Napoleon Form , MD, [id number] Age: 47 Referring MD:  Date of Birth: 04-20-1976 Gender: Female Account #: [id number] Procedure:                Colonoscopy Indications:              Positive Cologuard test Medicines:                Monitored Anesthesia Care Procedure:                Pre-Anesthesia Assessment:                           - Prior to the procedure, a History and Physical                            was performed, and patient medications and                            allergies were reviewed. The patient's tolerance of                            previous anesthesia was also reviewed. The risks                            and benefits of the procedure and the sedation                            options and risks were discussed with the patient.                            All questions were answered, and informed consent                            was obtained. Prior Anticoagulants: The patient has                            taken no anticoagulant or antiplatelet agents. ASA                            Grade Assessment: II - A patient with mild systemic                            disease. After reviewing the risks and benefits,                            the patient was deemed in satisfactory condition to                            undergo the procedure.                           After obtaining informed consent, the colonoscope  was passed under direct vision. Throughout the                            procedure, the patient's blood pressure, pulse, and                            oxygen saturations were monitored continuously. The                            [id number]-HQ190L Colonoscope [id number] was introduced                            through the anus and advanced to the the cecum,                            identified by appendiceal  orifice and ileocecal                            valve. The colonoscopy was performed without                            difficulty. The patient tolerated the procedure                            well. The quality of the bowel preparation was                            good. The ileocecal valve, appendiceal orifice, and                            rectum were photographed. Scope In: 10:57:28 AM Scope Out: 11:14:26 AM Scope Withdrawal Time: 0 hours 11 minutes 31 seconds  Total Procedure Duration: 0 hours 16 minutes 58 seconds  Findings:                 The perianal and digital rectal examinations were                            normal.                           Non-bleeding internal hemorrhoids were found during                            retroflexion. The hemorrhoids were medium-sized.                           The exam was otherwise without abnormality. Complications:            No immediate complications. Estimated Blood Loss:     Estimated blood loss: none. Impression:               - Non-bleeding internal hemorrhoids.                           - The examination was otherwise normal.                           -  No specimens collected. Recommendation:           - Patient has a contact number available for                            emergencies. The signs and symptoms of potential                            delayed complications were discussed with the                            patient. Return to normal activities tomorrow.                            Written discharge instructions were provided to the                            patient.                           - Resume previous diet.                           - Continue present medications.                           - Repeat colonoscopy in 10 years for surveillance. Napoleon Form, MD 10/04/2022 11:20:59 AM This report has been signed electronically.

## 2022-10-07 ENCOUNTER — Telehealth: Payer: Self-pay | Admitting: *Deleted

## 2022-10-07 NOTE — Telephone Encounter (Signed)
Follow up call attempt.  LVM to all if any questions or concerns.

## 2022-10-24 DIAGNOSIS — Z6820 Body mass index (BMI) 20.0-20.9, adult: Secondary | ICD-10-CM | POA: Diagnosis not present

## 2022-10-24 DIAGNOSIS — Z1231 Encounter for screening mammogram for malignant neoplasm of breast: Secondary | ICD-10-CM | POA: Diagnosis not present

## 2022-10-24 DIAGNOSIS — Z809 Family history of malignant neoplasm, unspecified: Secondary | ICD-10-CM | POA: Diagnosis not present

## 2022-10-24 DIAGNOSIS — Z01419 Encounter for gynecological examination (general) (routine) without abnormal findings: Secondary | ICD-10-CM | POA: Diagnosis not present

## 2023-02-18 ENCOUNTER — Other Ambulatory Visit: Payer: Self-pay

## 2023-02-18 MED ORDER — COMIRNATY 30 MCG/0.3ML IM SUSY
0.3000 mL | PREFILLED_SYRINGE | Freq: Once | INTRAMUSCULAR | 0 refills | Status: AC
  Filled 2023-02-18: qty 0.3, 1d supply, fill #0

## 2023-03-13 ENCOUNTER — Other Ambulatory Visit: Payer: Self-pay

## 2023-03-13 MED ORDER — SULFAMETHOXAZOLE-TRIMETHOPRIM 800-160 MG PO TABS
1.0000 | ORAL_TABLET | Freq: Two times a day (BID) | ORAL | 0 refills | Status: AC
  Filled 2023-03-13: qty 14, 7d supply, fill #0

## 2023-07-24 DIAGNOSIS — R7989 Other specified abnormal findings of blood chemistry: Secondary | ICD-10-CM | POA: Diagnosis not present

## 2023-08-07 DIAGNOSIS — Z0000 Encounter for general adult medical examination without abnormal findings: Secondary | ICD-10-CM | POA: Diagnosis not present

## 2023-08-07 DIAGNOSIS — Z1339 Encounter for screening examination for other mental health and behavioral disorders: Secondary | ICD-10-CM | POA: Diagnosis not present

## 2023-08-07 DIAGNOSIS — Z1331 Encounter for screening for depression: Secondary | ICD-10-CM | POA: Diagnosis not present

## 2023-10-09 ENCOUNTER — Other Ambulatory Visit: Payer: Self-pay

## 2023-10-09 DIAGNOSIS — R300 Dysuria: Secondary | ICD-10-CM | POA: Diagnosis not present

## 2023-10-09 DIAGNOSIS — N819 Female genital prolapse, unspecified: Secondary | ICD-10-CM | POA: Diagnosis not present

## 2023-10-09 MED ORDER — ESTRADIOL 0.1 MG/GM VA CREA
0.5000 g | TOPICAL_CREAM | Freq: Every day | VAGINAL | 2 refills | Status: AC
  Filled 2023-10-09: qty 42.5, 14d supply, fill #0
  Filled 2024-03-03: qty 42.5, 85d supply, fill #1

## 2023-10-09 MED ORDER — SULFAMETHOXAZOLE-TRIMETHOPRIM 800-160 MG PO TABS
1.0000 | ORAL_TABLET | Freq: Two times a day (BID) | ORAL | 0 refills | Status: DC
  Filled 2023-10-09: qty 10, 5d supply, fill #0

## 2023-10-10 ENCOUNTER — Other Ambulatory Visit: Payer: Self-pay

## 2023-10-16 DIAGNOSIS — N393 Stress incontinence (female) (male): Secondary | ICD-10-CM | POA: Diagnosis not present

## 2023-10-16 DIAGNOSIS — N8189 Other female genital prolapse: Secondary | ICD-10-CM | POA: Diagnosis not present

## 2023-10-31 DIAGNOSIS — N8189 Other female genital prolapse: Secondary | ICD-10-CM | POA: Diagnosis not present

## 2023-10-31 DIAGNOSIS — N393 Stress incontinence (female) (male): Secondary | ICD-10-CM | POA: Diagnosis not present

## 2023-11-07 DIAGNOSIS — N393 Stress incontinence (female) (male): Secondary | ICD-10-CM | POA: Diagnosis not present

## 2023-11-07 DIAGNOSIS — N8189 Other female genital prolapse: Secondary | ICD-10-CM | POA: Diagnosis not present

## 2023-12-10 ENCOUNTER — Other Ambulatory Visit: Payer: Self-pay

## 2023-12-10 DIAGNOSIS — Z124 Encounter for screening for malignant neoplasm of cervix: Secondary | ICD-10-CM | POA: Diagnosis not present

## 2023-12-10 DIAGNOSIS — Z01419 Encounter for gynecological examination (general) (routine) without abnormal findings: Secondary | ICD-10-CM | POA: Diagnosis not present

## 2023-12-10 DIAGNOSIS — Z1151 Encounter for screening for human papillomavirus (HPV): Secondary | ICD-10-CM | POA: Diagnosis not present

## 2023-12-10 DIAGNOSIS — Z1231 Encounter for screening mammogram for malignant neoplasm of breast: Secondary | ICD-10-CM | POA: Diagnosis not present

## 2023-12-10 DIAGNOSIS — Z6820 Body mass index (BMI) 20.0-20.9, adult: Secondary | ICD-10-CM | POA: Diagnosis not present

## 2023-12-10 MED ORDER — LO LOESTRIN FE 1 MG-10 MCG / 10 MCG PO TABS
1.0000 | ORAL_TABLET | Freq: Every day | ORAL | 3 refills | Status: AC
  Filled 2023-12-10: qty 84, 84d supply, fill #0
  Filled 2024-03-03: qty 84, 63d supply, fill #1
  Filled 2024-05-30: qty 84, 63d supply, fill #2
  Filled 2024-07-07 – 2024-07-09 (×2): qty 84, 84d supply, fill #3
  Filled 2024-07-14 (×3): qty 84, 63d supply, fill #3
  Filled ????-??-??: fill #3

## 2024-01-22 DIAGNOSIS — D225 Melanocytic nevi of trunk: Secondary | ICD-10-CM | POA: Diagnosis not present

## 2024-01-22 DIAGNOSIS — D485 Neoplasm of uncertain behavior of skin: Secondary | ICD-10-CM | POA: Diagnosis not present

## 2024-01-22 DIAGNOSIS — L570 Actinic keratosis: Secondary | ICD-10-CM | POA: Diagnosis not present

## 2024-01-22 DIAGNOSIS — L578 Other skin changes due to chronic exposure to nonionizing radiation: Secondary | ICD-10-CM | POA: Diagnosis not present

## 2024-01-22 DIAGNOSIS — L814 Other melanin hyperpigmentation: Secondary | ICD-10-CM | POA: Diagnosis not present

## 2024-01-22 DIAGNOSIS — D1801 Hemangioma of skin and subcutaneous tissue: Secondary | ICD-10-CM | POA: Diagnosis not present

## 2024-02-19 ENCOUNTER — Other Ambulatory Visit: Payer: Self-pay

## 2024-02-19 MED ORDER — COMIRNATY 30 MCG/0.3ML IM SUSY
0.3000 mL | PREFILLED_SYRINGE | Freq: Once | INTRAMUSCULAR | 0 refills | Status: DC
  Filled 2024-02-23 (×2): qty 0.3, 1d supply, fill #0

## 2024-02-23 ENCOUNTER — Other Ambulatory Visit: Payer: Self-pay

## 2024-03-03 ENCOUNTER — Other Ambulatory Visit (HOSPITAL_BASED_OUTPATIENT_CLINIC_OR_DEPARTMENT_OTHER): Payer: Self-pay

## 2024-03-03 ENCOUNTER — Other Ambulatory Visit (HOSPITAL_COMMUNITY): Payer: Self-pay

## 2024-03-03 ENCOUNTER — Other Ambulatory Visit: Payer: Self-pay

## 2024-03-09 ENCOUNTER — Other Ambulatory Visit (HOSPITAL_COMMUNITY): Payer: Self-pay

## 2024-03-09 ENCOUNTER — Other Ambulatory Visit: Payer: Self-pay

## 2024-04-07 ENCOUNTER — Other Ambulatory Visit: Payer: Self-pay

## 2024-04-07 MED ORDER — BUDESONIDE-FORMOTEROL FUMARATE 160-4.5 MCG/ACT IN AERO
1.0000 | INHALATION_SPRAY | RESPIRATORY_TRACT | 3 refills | Status: AC | PRN
  Filled 2024-04-07: qty 10.2, 10d supply, fill #0

## 2024-04-07 MED ORDER — ALBUTEROL SULFATE HFA 108 (90 BASE) MCG/ACT IN AERS
2.0000 | INHALATION_SPRAY | RESPIRATORY_TRACT | 0 refills | Status: AC | PRN
  Filled 2024-04-07: qty 18, 17d supply, fill #0

## 2024-07-08 ENCOUNTER — Other Ambulatory Visit: Payer: Self-pay

## 2024-07-09 ENCOUNTER — Other Ambulatory Visit: Payer: Self-pay

## 2024-07-09 MED ORDER — ONDANSETRON HCL 4 MG PO TABS
4.0000 mg | ORAL_TABLET | Freq: Four times a day (QID) | ORAL | 0 refills | Status: AC | PRN
  Filled 2024-07-09: qty 20, 5d supply, fill #0

## 2024-07-14 ENCOUNTER — Other Ambulatory Visit: Payer: Self-pay

## 2024-07-16 ENCOUNTER — Other Ambulatory Visit: Payer: Self-pay
